# Patient Record
Sex: Female | Born: 1975 | Race: White | Hispanic: No | Marital: Married | Smoking: Former smoker
Health system: Southern US, Community
[De-identification: ages and names within clinical notes are randomized; demographics above are authoritative.]

## PROBLEM LIST (undated history)

## (undated) DIAGNOSIS — K219 Gastro-esophageal reflux disease without esophagitis: Secondary | ICD-10-CM

## (undated) DIAGNOSIS — K5090 Crohn's disease, unspecified, without complications: Secondary | ICD-10-CM

## (undated) DIAGNOSIS — E039 Hypothyroidism, unspecified: Secondary | ICD-10-CM

## (undated) DIAGNOSIS — M1990 Unspecified osteoarthritis, unspecified site: Secondary | ICD-10-CM

## (undated) DIAGNOSIS — T7840XA Allergy, unspecified, initial encounter: Secondary | ICD-10-CM

## (undated) HISTORY — DX: Hypothyroidism, unspecified: E03.9

## (undated) HISTORY — DX: Gastro-esophageal reflux disease without esophagitis: K21.9

## (undated) HISTORY — DX: Unspecified osteoarthritis, unspecified site: M19.90

## (undated) HISTORY — PX: TUBAL LIGATION: SHX77

## (undated) HISTORY — PX: ANKLE SURGERY: SHX546

## (undated) HISTORY — DX: Allergy, unspecified, initial encounter: T78.40XA

## (undated) HISTORY — DX: Crohn's disease, unspecified, without complications: K50.90

---

## 2013-01-14 HISTORY — PX: THYROIDECTOMY: SHX17

## 2015-07-11 DIAGNOSIS — K529 Noninfective gastroenteritis and colitis, unspecified: Secondary | ICD-10-CM | POA: Insufficient documentation

## 2016-03-15 LAB — HM COLONOSCOPY

## 2017-08-21 ENCOUNTER — Ambulatory Visit: Payer: Self-pay | Admitting: Family Medicine

## 2017-08-28 ENCOUNTER — Ambulatory Visit (INDEPENDENT_AMBULATORY_CARE_PROVIDER_SITE_OTHER): Payer: 59 | Admitting: Family Medicine

## 2017-08-28 ENCOUNTER — Other Ambulatory Visit: Payer: Self-pay

## 2017-08-28 ENCOUNTER — Encounter: Payer: Self-pay | Admitting: Family Medicine

## 2017-08-28 VITALS — BP 128/82 | HR 74 | Temp 98.1°F | Ht 67.0 in | Wt 246.4 lb

## 2017-08-28 DIAGNOSIS — E890 Postprocedural hypothyroidism: Secondary | ICD-10-CM

## 2017-08-28 DIAGNOSIS — K529 Noninfective gastroenteritis and colitis, unspecified: Secondary | ICD-10-CM | POA: Diagnosis not present

## 2017-08-28 DIAGNOSIS — J309 Allergic rhinitis, unspecified: Secondary | ICD-10-CM

## 2017-08-28 DIAGNOSIS — Z62810 Personal history of physical and sexual abuse in childhood: Secondary | ICD-10-CM | POA: Diagnosis not present

## 2017-08-28 DIAGNOSIS — M7711 Lateral epicondylitis, right elbow: Secondary | ICD-10-CM

## 2017-08-28 DIAGNOSIS — E039 Hypothyroidism, unspecified: Secondary | ICD-10-CM

## 2017-08-28 HISTORY — DX: Hypothyroidism, unspecified: E03.9

## 2017-08-28 LAB — TSH: TSH: 0.85 u[IU]/mL (ref 0.35–4.50)

## 2017-08-28 NOTE — Patient Instructions (Signed)
Please return in 1-6 months for your annual complete physical with pap smear; please come fasting.  Please sign up for my chart.  I will release your lab results to you on your MyChart account with further instructions. Please reply with any questions.    It was a pleasure meeting you today! Thank you for choosing [place] to meet your healthcare needs! I truly look forward to working with you. If you have any questions or concerns, please send me a message via Mychart or call the office at [phone].   Tennis Elbow Tennis elbow (lateral epicondylitis) is inflammation of the outer tendons of your forearm close to your elbow. Your tendons attach your muscles to your bones. The outer tendons of your forearm are used to extend your wrist, and they attach on the outside part of your elbow. Tennis elbow is often found in people who play tennis, but anyone may get the condition from repeatedly extending the wrist or turning the forearm. What are the causes? This condition is caused by repeatedly extending your wrist and using your hands. It can result from sports or work that requires repetitive forearm movements. Tennis elbow may also be caused by an injury. What increases the risk? You have a higher risk of developing tennis elbow if you play tennis or another racquet sport. You also have a higher risk if you frequently use your hands for work. This condition is also more likely to develop in:  Musicians.  Carpenters, painters, and plumbers.  Cooks.  Cashiers.  People who work in [employer].  Architect workers.  Butchers.  People who use computers.  What are the signs or symptoms? Symptoms of this condition include:  Pain and tenderness in your forearm and the outer part of your elbow. You may only feel the pain when you use your arm, or you may feel it even when you are not using your arm.  A burning feeling that runs from your elbow through your arm.  Weak grip in your  hands.  How is this diagnosed? This condition may be diagnosed by medical history and physical exam. You may also have other tests, including:  X-rays.  MRI.  How is this treated? Your health care provider will recommend lifestyle adjustments, such as resting and icing your arm. Treatment may also include:  Medicines for inflammation. This may include shots of cortisone if your pain continues.  Physical therapy. This may include massage or exercises.  An elbow brace.  Surgery may eventually be recommended if your pain does not go away with treatment. Follow these instructions at home: Activity  Rest your elbow and wrist as directed by your health care provider. Try to avoid any activities that caused the problem until your health care provider says that you can do them again.  If a physical therapist teaches you exercises, do all of them as directed.  If you lift an object, lift it with your palm facing upward. This lowers the stress on your elbow. Lifestyle  If your tennis elbow is caused by sports, check your equipment and make sure that: ? You are using it correctly. ? It is the best fit for you.  If your tennis elbow is caused by work, take breaks frequently, if you are able. Talk with your manager about how to best perform tasks in a way that is safe. ? If your tennis elbow is caused by computer use, talk with your manager about any changes that can be made to your work  environment. General instructions  If directed, apply ice to the painful area: ? Put ice in a plastic bag. ? Place a towel between your skin and the bag. ? Leave the ice on for 20 minutes, 2-3 times per day.  Take medicines only as directed by your health care provider.  If you were given a brace, wear it as directed by your health care provider.  Keep all follow-up visits as directed by your health care provider. This is important. Contact a health care provider if:  Your pain does not get better  with treatment.  Your pain gets worse.  You have numbness or weakness in your forearm, hand, or fingers. This information is not intended to replace advice given to you by your health care provider. Make sure you discuss any questions you have with your health care provider. Document Released: 12/31/2004 Document Revised: 08/31/2015 Document Reviewed: 12/27/2013 Elsevier Interactive Patient Education  [employer].
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
## 2017-09-11 ENCOUNTER — Encounter: Payer: Self-pay | Admitting: Emergency Medicine

## 2017-10-28 ENCOUNTER — Ambulatory Visit (INDEPENDENT_AMBULATORY_CARE_PROVIDER_SITE_OTHER): Payer: 59 | Admitting: *Deleted

## 2017-10-28 DIAGNOSIS — Z23 Encounter for immunization: Secondary | ICD-10-CM

## 2017-10-28 DIAGNOSIS — Z111 Encounter for screening for respiratory tuberculosis: Secondary | ICD-10-CM

## 2017-10-30 ENCOUNTER — Encounter: Payer: Self-pay | Admitting: *Deleted

## 2017-10-30 LAB — TB SKIN TEST
Induration: 0 mm
TB Skin Test: NEGATIVE

## 2017-11-02 NOTE — Progress Notes (Signed)
Negative ppd

## 2017-12-25 ENCOUNTER — Other Ambulatory Visit (HOSPITAL_COMMUNITY)
Admission: RE | Admit: 2017-12-25 | Discharge: 2017-12-25 | Disposition: A | Discharge status: Home / Self Care | Payer: 59 | Source: Ambulatory Visit | Attending: Family Medicine | Admitting: Family Medicine

## 2017-12-25 ENCOUNTER — Ambulatory Visit (INDEPENDENT_AMBULATORY_CARE_PROVIDER_SITE_OTHER): Payer: 59 | Admitting: Family Medicine

## 2017-12-25 ENCOUNTER — Encounter: Payer: Self-pay | Admitting: Family Medicine

## 2017-12-25 ENCOUNTER — Other Ambulatory Visit: Payer: Self-pay

## 2017-12-25 VITALS — BP 132/88 | HR 88 | Temp 98.1°F | Resp 14 | Ht 67.0 in | Wt 249.0 lb

## 2017-12-25 DIAGNOSIS — Z124 Encounter for screening for malignant neoplasm of cervix: Secondary | ICD-10-CM | POA: Diagnosis not present

## 2017-12-25 DIAGNOSIS — Z0000 Encounter for general adult medical examination without abnormal findings: Secondary | ICD-10-CM | POA: Insufficient documentation

## 2017-12-25 DIAGNOSIS — K529 Noninfective gastroenteritis and colitis, unspecified: Secondary | ICD-10-CM

## 2017-12-25 DIAGNOSIS — E890 Postprocedural hypothyroidism: Secondary | ICD-10-CM

## 2017-12-25 DIAGNOSIS — J309 Allergic rhinitis, unspecified: Secondary | ICD-10-CM | POA: Diagnosis not present

## 2017-12-25 LAB — COMPREHENSIVE METABOLIC PANEL
ALT: 16 U/L (ref 0–35)
AST: 15 U/L (ref 0–37)
Albumin: 4 g/dL (ref 3.5–5.2)
Alkaline Phosphatase: 49 U/L (ref 39–117)
BUN: 18 mg/dL (ref 6–23)
CO2: 23 mEq/L (ref 19–32)
Calcium: 9.1 mg/dL (ref 8.4–10.5)
Chloride: 103 mEq/L (ref 96–112)
Creatinine, Ser: 0.77 mg/dL (ref 0.40–1.20)
GFR: 88.24 mL/min (ref >60.00)
Glucose, Bld: 86 mg/dL (ref 70–99)
Potassium: 3.8 mEq/L (ref 3.5–5.1)
Sodium: 133 mEq/L — ABNORMAL LOW (ref 135–145)
Total Bilirubin: 0.5 mg/dL (ref 0.2–1.2)
Total Protein: 7 g/dL (ref 6.0–8.3)

## 2017-12-25 LAB — CBC WITH DIFFERENTIAL/PLATELET
Basophils Absolute: 0 10*3/uL (ref 0.0–0.1)
Basophils Relative: 0.5 % (ref 0.0–3.0)
Eosinophils Absolute: 0.2 10*3/uL (ref 0.0–0.7)
Eosinophils Relative: 2.3 % (ref 0.0–5.0)
HCT: 41.4 % (ref 36.0–46.0)
Hemoglobin: 13.8 g/dL (ref 12.0–15.0)
Lymphocytes Relative: 25.5 % (ref 12.0–46.0)
Lymphs Abs: 2.3 10*3/uL (ref 0.7–4.0)
MCHC: 33.2 g/dL (ref 30.0–36.0)
MCV: 91.7 fl (ref 78.0–100.0)
Monocytes Absolute: 0.7 10*3/uL (ref 0.1–1.0)
Monocytes Relative: 7.9 % (ref 3.0–12.0)
Neutro Abs: 5.7 10*3/uL (ref 1.4–7.7)
Neutrophils Relative %: 63.8 % (ref 43.0–77.0)
Platelets: 266 10*3/uL (ref 150.0–400.0)
RBC: 4.51 Mil/uL (ref 3.87–5.11)
RDW: 13.7 % (ref 11.5–15.5)
WBC: 8.9 10*3/uL (ref 4.0–10.5)

## 2017-12-25 LAB — LIPID PANEL
Cholesterol: 173 mg/dL (ref 0–200)
HDL: 60.6 mg/dL (ref >39.00)
LDL Cholesterol: 97 mg/dL (ref 0–99)
NonHDL: 112.03
Total CHOL/HDL Ratio: 3
Triglycerides: 77 mg/dL (ref 0.0–149.0)
VLDL: 15.4 mg/dL (ref 0.0–40.0)

## 2017-12-25 LAB — TSH: TSH: 0.91 u[IU]/mL (ref 0.35–4.50)
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
## 2017-12-25 NOTE — Patient Instructions (Addendum)
Please return in 12 months for your annual complete physical; please come fasting.  I will release your lab results to you on your MyChart account with further instructions. Please reply with any questions.   We will call you with information regarding your referral appointment. Gastroenterology. If you do not hear from [place] within the next 2 weeks, please let me know. It can take 1-2 weeks to get appointments set up with the specialists.    If you have any questions or concerns, please don't hesitate to send me a message via MyChart or call the office at [phone]. Thank you for visiting with [place] today! It's our pleasure caring for you.  Please do these things to maintain good health!   Exercise at least 30-45 minutes a day,  4-5 days a week.   Eat a low-fat diet with lots of fruits and vegetables, up to 7-9 servings per day.  Drink plenty of water daily. Try to drink 8 8oz glasses per day.  Seatbelts can save your life. Always wear your seatbelt.  Place Smoke Detectors on every level of your home and check batteries every year.  Schedule an appointment with an eye doctor for an eye exam every 1-2 years  Safe sex - use condoms to protect yourself from STDs if you could be exposed to these types of infections. Use birth control if you do not want to become pregnant and are sexually active.  Avoid heavy alcohol use. If you drink, keep it to less than 2 drinks/day and not every day.  [place].  Choose someone you trust that could speak for you if you became unable to speak for yourself.  Depression is common in our stressful world.If you're feeling down or losing interest in things you normally enjoy, please come in for a visit.  If anyone is threatening or hurting you, please get help. Physical or Emotional Violence is never OK.

## 2017-12-26 LAB — HIV ANTIBODY (ROUTINE TESTING W REFLEX): HIV 1&2 Ab, 4th Generation: NONREACTIVE

## 2017-12-30 LAB — CYTOLOGY - PAP
Diagnosis: NEGATIVE
HPV: NOT DETECTED

## 2017-12-31 ENCOUNTER — Other Ambulatory Visit: Payer: Self-pay | Admitting: *Deleted

## 2017-12-31 ENCOUNTER — Encounter: Payer: Self-pay | Admitting: Family Medicine

## 2017-12-31 MED ORDER — LEVOTHYROXINE SODIUM 200 MCG PO TABS: 200.0000 ug | ORAL_TABLET | Freq: Every day | ORAL | 1 refills | Status: DC

## 2018-01-20 ENCOUNTER — Encounter: Payer: Self-pay | Admitting: Gastroenterology

## 2018-02-19 ENCOUNTER — Encounter: Payer: Self-pay | Admitting: Gastroenterology

## 2018-02-19 ENCOUNTER — Ambulatory Visit (INDEPENDENT_AMBULATORY_CARE_PROVIDER_SITE_OTHER): Payer: Commercial Managed Care - PPO | Admitting: Gastroenterology

## 2018-02-19 ENCOUNTER — Other Ambulatory Visit (INDEPENDENT_AMBULATORY_CARE_PROVIDER_SITE_OTHER): Payer: Commercial Managed Care - PPO

## 2018-02-19 VITALS — BP 116/78 | HR 84 | Ht 68.0 in | Wt 246.0 lb

## 2018-02-19 DIAGNOSIS — R110 Nausea: Secondary | ICD-10-CM

## 2018-02-19 DIAGNOSIS — K219 Gastro-esophageal reflux disease without esophagitis: Secondary | ICD-10-CM

## 2018-02-19 DIAGNOSIS — R197 Diarrhea, unspecified: Secondary | ICD-10-CM

## 2018-02-19 DIAGNOSIS — R1013 Epigastric pain: Secondary | ICD-10-CM

## 2018-02-19 DIAGNOSIS — K529 Noninfective gastroenteritis and colitis, unspecified: Secondary | ICD-10-CM | POA: Diagnosis not present

## 2018-02-19 LAB — HIGH SENSITIVITY CRP: CRP, High Sensitivity: 5.76 mg/L — ABNORMAL HIGH (ref 0.000–5.000)

## 2018-02-19 LAB — SEDIMENTATION RATE: Sed Rate: 20 mm/hr (ref 0–20)

## 2018-02-19 MED ORDER — OMEPRAZOLE 40 MG PO CPDR: 40.0000 mg | DELAYED_RELEASE_CAPSULE | Freq: Two times a day (BID) | ORAL | 3 refills | Status: DC

## 2018-02-19 MED ORDER — NA SULFATE-K SULFATE-MG SULF 17.5-3.13-1.6 GM/177ML PO SOLN: 1.0000 | Freq: Once | ORAL | 0 refills | Status: AC

## 2018-02-19 MED FILL — OMEPRAZOLE 40 MG CPDR: 40 | 30 days supply | Qty: 60 | Fill #0

## 2018-02-19 MED FILL — SUPREP BOWEL PREP KIT: 17.5-3.13-1 | 2 days supply | Qty: 354 | Fill #0

## 2018-02-19 NOTE — Progress Notes (Signed)
Sophia Blackwell    [id number]    [birth year]  Primary Care Physician:Andy, Karie Fetch, MD  Referring Physician: Leamon Arnt, MD [address] [place], [place] [address]  Chief complaint: Diarrhea, nausea, bloating, excessive gas, abdominal discomfort HPI:  43 year old female, her husband is a physician (Dr Ander Slade) with PCCM here to establish GI care.  She previously had EGD and colonoscopy in 2013 and 2018, was diagnosed with nonspecific colitis and also ?  Crohn's disease.  She was advised to start Humira but never started because she was concerned about being on immunosuppressive therapy as she was around patients with TB and fungal infections at her job. Her symptoms were stable but since they moved here in the past few months she is having worsening nausea associated with abdominal bloating/discomfort.  She also has multiple small bowel movements ranging from dislodged to watery diarrhea during the daytime.  Denies any nocturnal symptoms.  Stool is dark but no visible blood. She tried lactose-free and gluten-free diet with no significant improvement. Denies any weight loss, but has decreased appetite, fatigue, generalized joint pain (especially small and medium joints) and back pain.  No rash. She was on Apriso in the past, currently not on any maintenance therapy  Takes ibuprofen almost daily for joint pain.  Also is taking low-dose aspirin  EGD and colonoscopy March 2018 Procedure report not available in care everywhere, but able to access pathology report as listed below  Terminal ileal biopsy with mild active chronic inflammation and surface ulceration, no villous atrophy or architectural distortion. (1 of the ileal biopsies labeled is to rule out celiac?,  Probably was mix up in the sample or mislabeled) gastric biopsies negative for H. Pylori. Ascending, transverse, descending colon biopsies with mild chronic inflammation, no dysplasia or collagenous  thickening   Outpatient Encounter Medications as of 02/19/2018  Medication Sig  . aspirin EC 81 MG tablet Take 81 mg by mouth daily.  . cholecalciferol (VITAMIN D) 1000 units tablet Take 2,000 Units by mouth daily.  . famotidine (PEPCID) 10 MG tablet Take 10-20 mg by mouth daily.  . fexofenadine-pseudoephedrine (ALLEGRA-D 24) 180-240 MG 24 hr tablet Take 1 tablet by mouth daily.  . fluticasone (FLONASE) 50 MCG/ACT nasal spray Place into both nostrils daily.  Marland Kitchen levothyroxine (SYNTHROID, LEVOTHROID) 200 MCG tablet Take 1 tablet (200 mcg total) by mouth daily before breakfast.  . TURMERIC PO Take 1,000 mg by mouth.   No facility-administered encounter medications on file as of 02/19/2018.     Allergies as of 02/19/2018 - Review Complete 02/19/2018  Allergen Reaction Noted  . Dilaudid [hydromorphone hcl] Anaphylaxis 08/28/2017    Past Medical History:  Diagnosis Date  . Acquired hypothyroidism 08/28/2017  . Allergy   . Arthritis   . Crohn disease ([place])   . GERD (gastroesophageal reflux disease)     Past Surgical History:  Procedure Laterality Date  . THYROIDECTOMY  2015   benign nodules; done prophylactially due to strong FH of thyroid cancer in mom and GM  . TUBAL LIGATION      Family History  Problem Relation Age of Onset  . Rheum arthritis Mother   . Diverticulitis Mother   . Hypertension Mother   . Obesity Mother   . Thyroid cancer Mother   . Non-Hodgkin's lymphoma Father   . Rheum arthritis Brother   . Obesity Brother   . Healthy Daughter   . Healthy Son   . Thyroid  cancer Maternal Grandfather   . Healthy Daughter     Social History   Socioeconomic History  . Marital status: Married    Spouse name: Not on file  . Number of children: 3  . Years of education: Not on file  . Highest education level: Not on file  Occupational History  . Occupation: Forensic scientist  Social Needs  . Financial resource strain: Not on file  . Food insecurity:    Worry: Not on  file    Inability: Not on file  . Transportation needs:    Medical: Not on file    Non-medical: Not on file  Tobacco Use  . Smoking status: Former Research scientist (life sciences)  . Smokeless tobacco: Never Used  . Tobacco comment: quit 2003  Substance and Sexual Activity  . Alcohol use: Yes    Comment: occasionally   . Drug use: Never  . Sexual activity: Yes    Birth control/protection: Surgical    Comment: tubal  Lifestyle  . Physical activity:    Days per week: Not on file    Minutes per session: Not on file  . Stress: Not on file  Relationships  . Social connections:    Talks on phone: Not on file    Gets together: Not on file    Attends religious service: Not on file    Active member of club or organization: Not on file    Attends meetings of clubs or organizations: Not on file    Relationship status: Not on file  . Intimate partner violence:    Fear of current or ex partner: Not on file    Emotionally abused: Not on file    Physically abused: Not on file    Forced sexual activity: Not on file  Other Topics Concern  . Not on file  Social History Narrative   Husband is pulmonologist: joined [place] 2019      Review of systems: Review of Systems  Constitutional: Negative for fever and chills.  Positive for fatigue HENT: Positive for sinus trouble Eyes: Negative for blurred vision.  Respiratory: Positive for cough, negative for shortness of breath and wheezing.   Cardiovascular: Negative for chest pain and palpitations.  Gastrointestinal: as per HPI Genitourinary: Negative for dysuria, urgency, frequency and hematuria.  Musculoskeletal: Positive for myalgias, back pain and joint pain.  Skin: Negative for itching and rash.  Neurological: Negative for dizziness, tremors, focal weakness, seizures and loss of consciousness.  Endo/Heme/Allergies: Positive for seasonal allergies.  Psychiatric/Behavioral: Negative for depression, suicidal ideas and hallucinations.  All other systems  reviewed and are negative.   Physical Exam: Vitals:   02/19/18 0850  BP: 116/78  Pulse: 84   Body mass index is 37.4 kg/m. Gen:      No acute distress HEENT:  EOMI, sclera anicteric Neck:     No masses; no thyromegaly Lungs:    Clear to auscultation bilaterally; normal respiratory effort CV:         Regular rate and rhythm; no murmurs Abd:      + bowel sounds; soft, non-tender; no palpable masses, no distension Ext:    No edema; adequate peripheral perfusion Skin:      Warm and dry; no rash Neuro: alert and oriented x 3 Psych: normal mood and affect  Data Reviewed:  Reviewed labs, radiology imaging, old records and pertinent past GI work up   Assessment and Plan/Recommendations: 43 year old female with history of nonspecific chronic colitis, ?  IBD here to establish GI care  Severe nausea, dyspepsia, abdominal discomfort and diarrhea I reviewed the pathology report from [place], nonspecific with no architectural distortion or granulomas, not diagnostic of Crohn's disease History of frequent NSAID use, advised to hold all NSAIDs Will repeat EGD and colonoscopy with biopsies CRP and ESR Start omeprazole 40 mg daily for possible GERD related nausea Trial of FD guard and IBgard 1 capsule up to 3 times daily as needed The risks and benefits as well as alternatives of endoscopic procedure(s) have been discussed and reviewed. All questions answered. The patient agrees to proceed.    Damaris Hippo , MD [phone]    CC: Leamon Arnt, MD

## 2018-02-19 NOTE — Patient Instructions (Addendum)
You have been scheduled for an endoscopy and colonoscopy. Please follow the written instructions given to you at your visit today. Please pick up your prep supplies at the pharmacy within the next 1-3 days. If you use inhalers (even only as needed), please bring them with you on the day of your procedure.  Go to the basement for labs today   We will send Omeprazole to your pharmacy  Take FD/IBGard 1 capsule three times a day  Follow up in 2 months   If you are age 26 or older, your body mass index should be between 23-30. Your Body mass index is 37.4 kg/m. If this is out of the aforementioned range listed, please consider follow up with your Primary Care Provider.  If you are age 51 or younger, your body mass index should be between 19-25. Your Body mass index is 37.4 kg/m. If this is out of the aformentioned range listed, please consider follow up with your Primary Care Provider.    I appreciate the  opportunity to care for you  Thank You   Harl Bowie , MD

## 2018-02-23 ENCOUNTER — Encounter: Payer: Self-pay | Admitting: Gastroenterology

## 2018-03-02 MED FILL — LEVOTHYROXINE 200 MCG TAB: 200 | 30 days supply | Qty: 30 | Fill #0

## 2018-03-03 ENCOUNTER — Encounter (HOSPITAL_COMMUNITY): Payer: Self-pay

## 2018-03-03 ENCOUNTER — Other Ambulatory Visit: Payer: Self-pay

## 2018-03-03 ENCOUNTER — Ambulatory Visit (HOSPITAL_COMMUNITY)
Admission: EM | Admit: 2018-03-03 | Discharge: 2018-03-03 | Disposition: A | Discharge status: Home / Self Care | Payer: 59 | Attending: Family Medicine | Admitting: Family Medicine

## 2018-03-03 DIAGNOSIS — J020 Streptococcal pharyngitis: Secondary | ICD-10-CM

## 2018-03-03 LAB — POCT RAPID STREP A: Streptococcus, Group A Screen (Direct): NEGATIVE

## 2018-03-03 MED ORDER — PENICILLIN V POTASSIUM 500 MG PO TABS: 500.0000 mg | ORAL_TABLET | Freq: Two times a day (BID) | ORAL | 0 refills | Status: AC

## 2018-03-03 MED FILL — PENICILLIN VK 500 MG TABLET: 500 | 10 days supply | Qty: 20 | Fill #0

## 2018-03-03 NOTE — Discharge Instructions (Addendum)
Take 10 full days of penicillin Return for any problems

## 2018-03-03 NOTE — ED Triage Notes (Signed)
Pt cc ear discomfort and sore throat x 1 week.
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
## 2018-03-05 LAB — CULTURE, GROUP A STREP (THRC)

## 2018-03-09 ENCOUNTER — Encounter: Payer: Self-pay | Admitting: Gastroenterology

## 2018-03-09 ENCOUNTER — Ambulatory Visit (AMBULATORY_SURGERY_CENTER): Payer: 59 | Admitting: Gastroenterology

## 2018-03-09 VITALS — BP 124/78 | HR 78 | Temp 97.5°F | Resp 15 | Ht 68.0 in | Wt 212.0 lb

## 2018-03-09 DIAGNOSIS — R1013 Epigastric pain: Secondary | ICD-10-CM

## 2018-03-09 DIAGNOSIS — K3189 Other diseases of stomach and duodenum: Secondary | ICD-10-CM | POA: Diagnosis not present

## 2018-03-09 DIAGNOSIS — K5730 Diverticulosis of large intestine without perforation or abscess without bleeding: Secondary | ICD-10-CM | POA: Diagnosis not present

## 2018-03-09 DIAGNOSIS — K648 Other hemorrhoids: Secondary | ICD-10-CM | POA: Diagnosis not present

## 2018-03-09 DIAGNOSIS — K2970 Gastritis, unspecified, without bleeding: Secondary | ICD-10-CM

## 2018-03-09 DIAGNOSIS — R1030 Lower abdominal pain, unspecified: Secondary | ICD-10-CM

## 2018-03-09 DIAGNOSIS — K219 Gastro-esophageal reflux disease without esophagitis: Secondary | ICD-10-CM | POA: Diagnosis not present

## 2018-03-09 DIAGNOSIS — R110 Nausea: Secondary | ICD-10-CM

## 2018-03-09 DIAGNOSIS — R197 Diarrhea, unspecified: Secondary | ICD-10-CM | POA: Diagnosis not present

## 2018-03-09 MED ORDER — SODIUM CHLORIDE 0.9 % IV SOLN: 500.0000 mL | Freq: Once | INTRAVENOUS | Status: DC

## 2018-03-09 NOTE — Progress Notes (Signed)
To PACU, VSS. Report to [email or web address] 

## 2018-03-09 NOTE — Op Note (Signed)
[facility] Patient Name: Sophia Blackwell Procedure Date: 03/09/2018 3:02 PM MRN: [id number] Endoscopist: Mauri Pole , MD Age: 43 Referring MD:  Date of Birth: 14-Jan-1976 Gender: Female Account #: [id number] Procedure:                Upper GI endoscopy Indications:              Epigastric abdominal pain, Dyspepsia, Heartburn.                            History of gastric erosions and ulcers. Medicines:                Monitored Anesthesia Care Procedure:                Pre-Anesthesia Assessment:                           - Prior to the procedure, a History and Physical                            was performed, and patient medications and                            allergies were reviewed. The patient's tolerance of                            previous anesthesia was also reviewed. The risks                            and benefits of the procedure and the sedation                            options and risks were discussed with the patient.                            All questions were answered, and informed consent                            was obtained. Prior Anticoagulants: The patient has                            taken no previous anticoagulant or antiplatelet                            agents. ASA Grade Assessment: II - A patient with                            mild systemic disease. After reviewing the risks                            and benefits, the patient was deemed in                            satisfactory condition to undergo the procedure.  After obtaining informed consent, the endoscope was                            passed under direct vision. Throughout the                            procedure, the patient's blood pressure, pulse, and                            oxygen saturations were monitored continuously. The                            Endoscope was introduced through the mouth, and                            advanced to  the second part of duodenum. The upper                            GI endoscopy was accomplished without difficulty.                            The patient tolerated the procedure well. Scope In: Scope Out: Findings:                 The Z-line was regular and was found 40 cm from the                            incisors.                           Patchy mildly erythematous mucosa without bleeding                            was found in the entire examined stomach. Biopsies                            were taken with a cold forceps for Helicobacter                            pylori testing.                           The first portion of the duodenum and second                            portion of the duodenum were normal. Biopsies were                            taken with a cold forceps for histology. Complications:            No immediate complications. Estimated Blood Loss:     Estimated blood loss was minimal. Impression:               - Z-line regular, 40 cm from the incisors.                           -  Erythematous mucosa in the stomach. Biopsied.                           - Normal first portion of the duodenum and second                            portion of the duodenum. Biopsied. Recommendation:           - Resume previous diet.                           - Continue present medications.                           - Await pathology results.                           - See the other procedure note for documentation of                            additional recommendations. Mauri Pole, MD 03/09/2018 3:52:56 PM This report has been signed electronically.

## 2018-03-09 NOTE — Patient Instructions (Signed)
YOU HAD AN ENDOSCOPIC PROCEDURE TODAY AT [facility]:   Refer to the procedure report that was given to you for any specific questions about what was found during the examination.  If the procedure report does not answer your questions, please call your gastroenterologist to clarify.  If you requested that your care partner not be given the details of your procedure findings, then the procedure report has been included in a sealed envelope for you to review at your convenience later.  YOU SHOULD EXPECT: Some feelings of bloating in the abdomen. Passage of more gas than usual.  Walking can help get rid of the air that was put into your GI tract during the procedure and reduce the bloating. If you had a lower endoscopy (such as a colonoscopy or flexible sigmoidoscopy) you may notice spotting of blood in your stool or on the toilet paper. If you underwent a bowel prep for your procedure, you may not have a normal bowel movement for a few days.  Please Note:  You might notice some irritation and congestion in your nose or some drainage.  This is from the oxygen used during your procedure.  There is no need for concern and it should clear up in a day or so.  SYMPTOMS TO REPORT IMMEDIATELY:   Following lower endoscopy (colonoscopy or flexible sigmoidoscopy):  Excessive amounts of blood in the stool  Significant tenderness or worsening of abdominal pains  Swelling of the abdomen that is new, acute  Fever of 100F or higher   Following upper endoscopy (EGD)  Vomiting of blood or coffee ground material  New chest pain or pain under the shoulder blades  Painful or persistently difficult swallowing  New shortness of breath  Fever of 100F or higher  Black, tarry-looking stools  For urgent or emergent issues, a gastroenterologist can be reached at any hour by calling [phone].   DIET:  We do recommend a small meal at first, but then you may proceed to your regular diet.  Drink  plenty of fluids but you should avoid alcoholic beverages for 24 hours.   MEDICATIONS: Continue present medications. No Ibuprofen, Alleve, Naproxen, or other non-steroidal anti-inflammatory drugs indefinitely. Tylenol is OK as needed.  Please see handouts given to you by your recovery nurse.  Follow up: Visit Dr. Silverio Decamp in her office at the next available appointment. Dr. Woodward Ku nurse will call you to schedule this.  ACTIVITY:  You should plan to take it easy for the rest of today and you should NOT DRIVE or use heavy machinery until tomorrow (because of the sedation medicines used during the test).    FOLLOW UP: Our staff will call the number listed on your records the next business day following your procedure to check on you and address any questions or concerns that you may have regarding the information given to you following your procedure. If we do not reach you, we will leave a message.  However, if you are feeling well and you are not experiencing any problems, there is no need to return our call.  We will assume that you have returned to your regular daily activities without incident.  If any biopsies were taken you will be contacted by phone or by letter within the next 1-3 weeks.  Please call us at [phone] if you have not heard about the biopsies in 3 weeks.   Thank you for allowing [place] to provide for your healthcare needs today.  SIGNATURES/CONFIDENTIALITY: You  and/or your care partner have signed paperwork which will be entered into your electronic medical record.  These signatures attest to the fact that that the information above on your After Visit Summary has been reviewed and is understood.  Full responsibility of the confidentiality of this discharge information lies with you and/or your care-partner.

## 2018-03-09 NOTE — Op Note (Signed)
[place] Patient Name: Sophia Blackwell Procedure Date: 03/09/2018 3:02 PM MRN: [id number] Endoscopist: Mauri Pole , MD Age: 43 Referring MD:  Date of Birth: 1975-03-30 Gender: Female Account #: [id number] Procedure:                Colonoscopy Indications:              Obtain more precise diagnosis of inflammatory bowel                            disease, Clinically significant diarrhea of                            unexplained origin, Epigastric abdominal pain,                            Lower abdominal pain Medicines:                Monitored Anesthesia Care Procedure:                Pre-Anesthesia Assessment:                           - Prior to the procedure, a History and Physical                            was performed, and patient medications and                            allergies were reviewed. The patient's tolerance of                            previous anesthesia was also reviewed. The risks                            and benefits of the procedure and the sedation                            options and risks were discussed with the patient.                            All questions were answered, and informed consent                            was obtained. Prior Anticoagulants: The patient has                            taken no previous anticoagulant or antiplatelet                            agents. ASA Grade Assessment: II - A patient with                            mild systemic disease. After reviewing the risks  and benefits, the patient was deemed in                            satisfactory condition to undergo the procedure.                           After obtaining informed consent, the colonoscope                            was passed under direct vision. Throughout the                            procedure, the patient's blood pressure, pulse, and                            oxygen saturations were monitored  continuously. The                            Model [id number]-[id number]) scope was introduced                            through the anus and advanced to the the terminal                            ileum, with identification of the appendiceal                            orifice and IC valve. The colonoscopy was performed                            without difficulty. The patient tolerated the                            procedure well. The quality of the bowel                            preparation was excellent. The ileocecal valve,                            appendiceal orifice, and rectum were photographed. Scope In: 3:21:02 PM Scope Out: 3:45:02 PM Scope Withdrawal Time: 0 hours 18 minutes 37 seconds  Total Procedure Duration: 0 hours 24 minutes 0 seconds  Findings:                 The perianal and digital rectal examinations were                            normal.                           Normal mucosa was found in the entire colon.                            Biopsies were taken with a cold forceps for  histology.                           The terminal ileum appeared normal. Biopsies were                            taken with a cold forceps for histology.                           A few small and large-mouthed diverticula were                            found in the sigmoid colon and descending colon.                           Non-bleeding internal hemorrhoids were found during                            retroflexion. The hemorrhoids were small. Complications:            No immediate complications. Estimated Blood Loss:     Estimated blood loss was minimal. Impression:               - Normal mucosa in the entire examined colon.                            Biopsied.                           - The examined portion of the ileum was normal.                            Biopsied.                           - Mild diverticulosis in the sigmoid colon and in                             the descending colon.                           - Non-bleeding internal hemorrhoids. Recommendation:           - Patient has a contact number available for                            emergencies. The signs and symptoms of potential                            delayed complications were discussed with the                            patient. Return to normal activities tomorrow.                            Written discharge instructions were provided to the  patient.                           - Resume previous diet.                           - Continue present medications.                           - Await pathology results.                           - Return to GI clinic at the next available                            appointment. Mauri Pole, MD 03/09/2018 3:56:29 PM This report has been signed electronically.

## 2018-03-10 ENCOUNTER — Telehealth: Payer: Self-pay

## 2018-03-10 NOTE — Telephone Encounter (Signed)
  Follow up Call-  Call back number 03/09/2018  Post procedure Call Back phone  # [phone]  Permission to leave phone message Yes  Some recent data might be hidden     Patient questions:  Do you have a fever, pain , or abdominal swelling? No. Pain Score  0 *  Have you tolerated food without any problems? Yes.    Have you been able to return to your normal activities? Yes.    Do you have any questions about your discharge instructions: Diet   No. Medications  No. Follow up visit  No.  Do you have questions or concerns about your Care? No.  Actions: * If pain score is 4 or above: No action needed, pain <4.  No problems noted per pt. maw

## 2018-03-17 ENCOUNTER — Other Ambulatory Visit: Payer: Self-pay

## 2018-03-17 DIAGNOSIS — R1013 Epigastric pain: Secondary | ICD-10-CM

## 2018-03-17 DIAGNOSIS — K529 Noninfective gastroenteritis and colitis, unspecified: Secondary | ICD-10-CM

## 2018-04-01 ENCOUNTER — Other Ambulatory Visit: Payer: Self-pay

## 2018-04-01 ENCOUNTER — Ambulatory Visit (INDEPENDENT_AMBULATORY_CARE_PROVIDER_SITE_OTHER): Payer: 59 | Admitting: Gastroenterology

## 2018-04-01 ENCOUNTER — Encounter: Payer: Self-pay | Admitting: Gastroenterology

## 2018-04-01 DIAGNOSIS — K5090 Crohn's disease, unspecified, without complications: Secondary | ICD-10-CM

## 2018-04-01 NOTE — Progress Notes (Signed)
Pt here for capsule endo, completed prep without any issues.   Capsule lot #[id number] Exp:  04/05/2019

## 2018-04-04 MED FILL — LEVOTHYROXINE 200 MCG TAB: 200 | 30 days supply | Qty: 30 | Fill #1

## 2018-04-04 MED FILL — OMEPRAZOLE 40 MG CPDR: 40 | 30 days supply | Qty: 60 | Fill #1

## 2018-04-08 ENCOUNTER — Ambulatory Visit: Payer: 59 | Admitting: Gastroenterology

## 2018-04-14 ENCOUNTER — Telehealth: Payer: Self-pay | Admitting: Gastroenterology

## 2018-04-14 MED ORDER — BUDESONIDE 3 MG PO CPEP: 9.0000 mg | ORAL_CAPSULE | Freq: Every day | ORAL | 0 refills | Status: DC

## 2018-04-14 NOTE — Telephone Encounter (Signed)
Called and discussed results.  Minimal changes with erythema and few aphthous ulcers. Non specific ?NSAID related injury vs mild Crohn's disease Plan to start Entocort 9mg  daily and continue for a month. Will reassess response in 3-4 weeks and decide if need to continue it any longer.  Beth, please schedule virtual office visit with [facility] in 3 weeks. Thanks

## 2018-04-15 NOTE — Telephone Encounter (Signed)
She is scheduled for an appointment on 05/01/18. We will contact her to change this to a Web[facility] appointment.

## 2018-04-20 MED FILL — BUDESONIDE 3 MG CPEP: 3 | 30 days supply | Qty: 90 | Fill #0

## 2018-05-01 ENCOUNTER — Ambulatory Visit: Payer: 59 | Admitting: Gastroenterology

## 2018-05-19 ENCOUNTER — Encounter: Payer: Self-pay | Admitting: General Surgery

## 2018-05-20 ENCOUNTER — Ambulatory Visit (INDEPENDENT_AMBULATORY_CARE_PROVIDER_SITE_OTHER): Payer: 59 | Admitting: Gastroenterology

## 2018-05-20 ENCOUNTER — Other Ambulatory Visit: Payer: Self-pay

## 2018-05-20 VITALS — Ht 68.0 in | Wt 234.0 lb

## 2018-05-20 DIAGNOSIS — K5000 Crohn's disease of small intestine without complications: Secondary | ICD-10-CM | POA: Diagnosis not present

## 2018-05-20 DIAGNOSIS — K580 Irritable bowel syndrome with diarrhea: Secondary | ICD-10-CM

## 2018-05-20 DIAGNOSIS — K219 Gastro-esophageal reflux disease without esophagitis: Secondary | ICD-10-CM

## 2018-05-20 NOTE — Patient Instructions (Addendum)
Continue Entocort 9 mg daily for additional 6 weeks to complete 49-month course  After that taper to 6 mg daily for 2 weeks and then to 3 mg daily for additional 2 weeks  Avoid NSAIDs  Continue Protonix  Antireflux measures  Follow-up tele visit in 4 weeks   Gastroesophageal Reflux Disease, Adult Gastroesophageal reflux (GER) happens when acid from the stomach flows up into the tube that connects the mouth and the stomach (esophagus). Normally, food travels down the esophagus and stays in the stomach to be digested. However, when a person has GER, food and stomach acid sometimes move back up into the esophagus. If this becomes a more serious problem, the person may be diagnosed with a disease called gastroesophageal reflux disease (GERD). GERD occurs when the reflux:  Happens often.  Causes frequent or severe symptoms.  Causes problems such as damage to the esophagus. When stomach acid comes in contact with the esophagus, the acid may cause soreness (inflammation) in the esophagus. Over time, GERD may create small holes (ulcers) in the lining of the esophagus. What are the causes? This condition is caused by a problem with the muscle between the esophagus and the stomach (lower esophageal sphincter, or LES). Normally, the LES muscle closes after food passes through the esophagus to the stomach. When the LES is weakened or abnormal, it does not close properly, and that allows food and stomach acid to go back up into the esophagus. The LES can be weakened by certain dietary substances, medicines, and medical conditions, including:  Tobacco use.  Pregnancy.  Having a hiatal hernia.  Alcohol use.  Certain foods and beverages, such as coffee, chocolate, onions, and peppermint. What increases the risk? You are more likely to develop this condition if you:  Have an increased body weight.  Have a connective tissue disorder.  Use NSAID medicines. What are the signs or  symptoms? Symptoms of this condition include:  Heartburn.  Difficult or painful swallowing.  The feeling of having a lump in the throat.  Abitter taste in the mouth.  Bad breath.  Having a large amount of saliva.  Having an upset or bloated stomach.  Belching.  Chest pain. Different conditions can cause chest pain. Make sure you see your health care provider if you experience chest pain.  Shortness of breath or wheezing.  Ongoing (chronic) cough or a night-time cough.  Wearing away of tooth enamel.  Weight loss. How is this diagnosed? Your health care provider will take a medical history and perform a physical exam. To determine if you have mild or severe GERD, your health care provider may also monitor how you respond to treatment. You may also have tests, including:  A test to examine your stomach and esophagus with a small camera (endoscopy).  A test thatmeasures the acidity level in your esophagus.  A test thatmeasures how much pressure is on your esophagus.  A barium swallow or modified barium swallow test to show the shape, size, and functioning of your esophagus. How is this treated? The goal of treatment is to help relieve your symptoms and to prevent complications. Treatment for this condition may vary depending on how severe your symptoms are. Your health care provider may recommend:  Changes to your diet.  Medicine.  Surgery. Follow these instructions at home: Eating and drinking   Follow a diet as recommended by your health care provider. This may involve avoiding foods and drinks such as: ? Coffee and tea (with or without caffeine). ?  Drinks that containalcohol. ? Energy drinks and sports drinks. ? Carbonated drinks or sodas. ? Chocolate and cocoa. ? Peppermint and mint flavorings. ? Garlic and onions. ? Horseradish. ? Spicy and acidic foods, including peppers, chili powder, curry powder, vinegar, hot sauces, and barbecue sauce. ? Citrus  fruit juices and citrus fruits, such as oranges, lemons, and limes. ? Tomato-based foods, such as red sauce, chili, salsa, and pizza with red sauce. ? Fried and fatty foods, such as donuts, french fries, potato chips, and high-fat dressings. ? High-fat meats, such as hot dogs and fatty cuts of red and white meats, such as rib eye steak, sausage, ham, and bacon. ? High-fat dairy items, such as whole milk, butter, and cream cheese.  Eat small, frequent meals instead of large meals.  Avoid drinking large amounts of liquid with your meals.  Avoid eating meals during the 2-3 hours before bedtime.  Avoid lying down right after you eat.  Do not exercise right after you eat. Lifestyle   Do not use any products that contain nicotine or tobacco, such as cigarettes, e-cigarettes, and chewing tobacco. If you need help quitting, ask your health care provider.  Try to reduce your stress by using methods such as yoga or meditation. If you need help reducing stress, ask your health care provider.  If you are overweight, reduce your weight to an amount that is healthy for you. Ask your health care provider for guidance about a safe weight loss goal. General instructions  Pay attention to any changes in your symptoms.  Take over-the-counter and prescription medicines only as told by your health care provider. Do not take aspirin, ibuprofen, or other NSAIDs unless your health care provider told you to do so.  Wear loose-fitting clothing. Do not wear anything tight around your waist that causes pressure on your abdomen.  Raise (elevate) the head of your bed about 6 inches (15 cm).  Avoid bending over if this makes your symptoms worse.  Keep all follow-up visits as told by your health care provider. This is important. Contact a health care provider if:  You have: ? New symptoms. ? Unexplained weight loss. ? Difficulty swallowing or it hurts to swallow. ? Wheezing or a persistent cough. ? A  hoarse voice.  Your symptoms do not improve with treatment. Get help right away if you:  Have pain in your arms, neck, jaw, teeth, or back.  Feel sweaty, dizzy, or light-headed.  Have chest pain or shortness of breath.  Vomit and your vomit looks like blood or coffee grounds.  Faint.  Have stool that is bloody or black.  Cannot swallow, drink, or eat. Summary  Gastroesophageal reflux happens when acid from the stomach flows up into the esophagus. GERD is a disease in which the reflux happens often, causes frequent or severe symptoms, or causes problems such as damage to the esophagus.  Treatment for this condition may vary depending on how severe your symptoms are. Your health care provider may recommend diet and lifestyle changes, medicine, or surgery.  Contact a health care provider if you have new or worsening symptoms.  Take over-the-counter and prescription medicines only as told by your health care provider. Do not take aspirin, ibuprofen, or other NSAIDs unless your health care provider told you to do so.  Keep all follow-up visits as told by your health care provider. This is important. This information is not intended to replace advice given to you by your health care provider. Make sure you discuss  any questions you have with your health care provider. Document Released: 10/10/2004 Document Revised: 07/09/2017 Document Reviewed: 07/09/2017 Elsevier Interactive Patient Education  2019 [employer].  I appreciate the  opportunity to care for you  Thank You   Harl Bowie , MD

## 2018-05-20 NOTE — Progress Notes (Signed)
Sophia Blackwell    [id number]    [birth year]  Primary Care Physician:Andy, Karie Fetch, MD  Referring Physician: Leamon Arnt, MD [address] [place] [address] [place], [place] [address]  This service was provided via audio and video telemedicine (Doximity) due to [place] 19 pandemic.  Patient location: Home Provider location: Office Used 2 patient identifiers to confirm the correct person. Explained the limitations in evaluation and management via telemedicine. Patient is aware of potential medical charges for this visit.  Patient consented to this virtual visit.  The persons participating in this telemedicine service were myself and the patient  Chief complaint:    HPI: Last office visit February 19, 2018 EGD and colonoscopy March 09, 2018 showed gastritis, biopsies negative for H. pylori.  Colon and terminal ileum appeared normal. Capsule endoscopy April 01, 2018 showed patchy erythema and small superficial erosions scattered in the small bowel  She is taking Protonix daily with improvement of epigastric abdominal pain and nausea.  Continues to have intermittent abdominal cramping and increased bowel frequency with 3-6 bowel movements daily.  Denies any melena or rectal bleeding. She is avoiding taking NSAIDs daily.  Drinks multiple cups of coffee throughout the day. Started taking Entocort 2 weeks ago, has not noticed any significant improvement in her symptoms.  Previous HPI: 43 year old female, her husband is a physician (Dr Ander Slade) with PCCM here to establish GI care.  She previously had EGD and colonoscopy in 2013 and 2018, was diagnosed with nonspecific colitis and also ?  Crohn's disease.  She was advised to start Humira but never started because she was concerned about being on immunosuppressive therapy as she was around patients with TB and fungal infections at her job. Her symptoms were stable but since they moved here in the past few months she is having worsening  nausea associated with abdominal bloating/discomfort.  She also has multiple small bowel movements ranging from dislodged to watery diarrhea during the daytime.  Denies any nocturnal symptoms.  Stool is dark but no visible blood. She tried lactose-free and gluten-free diet with no significant improvement. Denies any weight loss, but has decreased appetite, fatigue, generalized joint pain (especially small and medium joints) and back pain.  No rash. She was on Apriso in the past, currently not on any maintenance therapy  Takes ibuprofen almost daily for joint pain.  Also is taking low-dose aspirin  Outpatient Encounter Medications as of 05/20/2018  Medication Sig  . aspirin EC 81 MG tablet Take 81 mg by mouth daily.  . budesonide (ENTOCORT EC) 3 MG 24 hr capsule Take 3 capsules (9 mg total) by mouth daily.  . cholecalciferol (VITAMIN D) 1000 units tablet Take 2,000 Units by mouth daily.  . fexofenadine-pseudoephedrine (ALLEGRA-D 24) 180-240 MG 24 hr tablet Take 1 tablet by mouth daily.  . fluticasone (FLONASE) 50 MCG/ACT nasal spray Place into both nostrils daily.  Marland Kitchen levothyroxine (SYNTHROID, LEVOTHROID) 200 MCG tablet Take 1 tablet (200 mcg total) by mouth daily before breakfast.  . omeprazole (PRILOSEC) 40 MG capsule Take 1 capsule (40 mg total) by mouth 2 (two) times daily.  . TURMERIC PO Take 1,000 mg by mouth.   Facility-Administered Encounter Medications as of 05/20/2018  Medication  . 0.9 %  sodium chloride infusion    Allergies as of 05/20/2018 - Review Complete 05/19/2018  Allergen Reaction Noted  . Dilaudid [hydromorphone hcl] Anaphylaxis 08/28/2017    Past Medical  History:  Diagnosis Date  . Acquired hypothyroidism 08/28/2017  . Allergy   . Arthritis   . Crohn disease ([place])   . GERD (gastroesophageal reflux disease)     Past Surgical History:  Procedure Laterality Date  . THYROIDECTOMY  2015   benign nodules; done prophylactially due to strong FH of thyroid cancer in mom  and GM  . TUBAL LIGATION      Family History  Problem Relation Age of Onset  . Rheum arthritis Mother   . Diverticulitis Mother   . Hypertension Mother   . Obesity Mother   . Thyroid cancer Mother   . Non-Hodgkin's lymphoma Father   . Rheum arthritis Brother   . Obesity Brother   . Healthy Daughter   . Healthy Son   . Thyroid cancer Maternal Grandfather   . Healthy Daughter     Social History   Socioeconomic History  . Marital status: Married    Spouse name: Not on file  . Number of children: 3  . Years of education: Not on file  . Highest education level: Not on file  Occupational History  . Occupation: Forensic scientist  Social Needs  . Financial resource strain: Not on file  . Food insecurity:    Worry: Not on file    Inability: Not on file  . Transportation needs:    Medical: Not on file    Non-medical: Not on file  Tobacco Use  . Smoking status: Former Research scientist (life sciences)  . Smokeless tobacco: Never Used  . Tobacco comment: quit 2003  Substance and Sexual Activity  . Alcohol use: Yes    Comment: occasionally   . Drug use: Never  . Sexual activity: Yes    Birth control/protection: Surgical    Comment: tubal  Lifestyle  . Physical activity:    Days per week: Not on file    Minutes per session: Not on file  . Stress: Not on file  Relationships  . Social connections:    Talks on phone: Not on file    Gets together: Not on file    Attends religious service: Not on file    Active member of club or organization: Not on file    Attends meetings of clubs or organizations: Not on file    Relationship status: Not on file  . Intimate partner violence:    Fear of current or ex partner: Not on file    Emotionally abused: Not on file    Physically abused: Not on file    Forced sexual activity: Not on file  Other Topics Concern  . Not on file  Social History Narrative   Husband is pulmonologist: joined [facility] 2019      Review of systems: Review of Systems as per  HPI All other systems reviewed and are negative.   Physical Exam: Vitals were not taken and physical exam was not performed during this virtual visit.  Data Reviewed:  Reviewed labs, radiology imaging, old records and pertinent past GI work up   Assessment and Plan/Recommendations:  43 year old female with chronic GERD, irritable bowel syndrome and ?  Crohn's disease Based on capsule endoscopy has patchy aphthous ulcers in the small bowel could be secondary to NSAID use but cannot exclude mild Crohn's disease  We will continue Entocort 9 mg daily for 2 months followed by taper over a month. We will hold off starting Biologics for now. If continues to have persistent symptoms, will consider checking serologic markers for Crohn's  We will  consider repeat small bowel video capsule in a year to see if has any improvement of small bowel mucosa after prolonged abstinence from using frequent NSAIDs  Continue to avoid NSAIDs  Decrease caffeine intake  Continue Protonix and antireflux measures  Follow-up in 4 weeks    K. Denzil Magnuson , MD   CC: Leamon Arnt, MD

## 2018-05-27 ENCOUNTER — Other Ambulatory Visit: Payer: Self-pay | Admitting: Gastroenterology

## 2018-05-27 MED FILL — BUDESONIDE 3 MG CPEP: 3 | 30 days supply | Qty: 90 | Fill #0

## 2018-05-27 MED FILL — LEVOTHYROXINE 200 MCG TAB: 200 | 30 days supply | Qty: 30 | Fill #2

## 2018-06-29 MED FILL — OMEPRAZOLE 40 MG CPDR: 40 | 30 days supply | Qty: 60 | Fill #2

## 2018-06-29 MED FILL — LEVOTHYROXINE 200 MCG TAB: 200 | 30 days supply | Qty: 30 | Fill #3

## 2018-08-10 MED FILL — LEVOTHYROXINE 200 MCG TAB: 200 | 30 days supply | Qty: 30 | Fill #4

## 2018-08-10 MED FILL — OMEPRAZOLE 40 MG CPDR: 40 | 30 days supply | Qty: 60 | Fill #3

## 2018-08-11 ENCOUNTER — Other Ambulatory Visit: Payer: Self-pay

## 2018-08-11 ENCOUNTER — Other Ambulatory Visit: Payer: Self-pay | Admitting: Family Medicine

## 2018-08-11 ENCOUNTER — Encounter: Payer: Self-pay | Admitting: Family Medicine

## 2018-08-11 ENCOUNTER — Ambulatory Visit (INDEPENDENT_AMBULATORY_CARE_PROVIDER_SITE_OTHER): Payer: 59

## 2018-08-11 ENCOUNTER — Ambulatory Visit: Payer: 59 | Admitting: Family Medicine

## 2018-08-11 VITALS — BP 118/84 | HR 83 | Temp 98.0°F | Resp 16 | Ht 67.0 in | Wt 235.2 lb

## 2018-08-11 DIAGNOSIS — K0889 Other specified disorders of teeth and supporting structures: Secondary | ICD-10-CM | POA: Diagnosis not present

## 2018-08-11 DIAGNOSIS — J320 Chronic maxillary sinusitis: Secondary | ICD-10-CM | POA: Diagnosis not present

## 2018-08-11 DIAGNOSIS — M25551 Pain in right hip: Secondary | ICD-10-CM | POA: Diagnosis not present

## 2018-08-11 DIAGNOSIS — R51 Headache: Secondary | ICD-10-CM | POA: Diagnosis not present

## 2018-08-11 MED ORDER — AMOXICILLIN-POT CLAVULANATE 875-125 MG PO TABS: 1.0000 | ORAL_TABLET | Freq: Two times a day (BID) | ORAL | 0 refills | Status: DC

## 2018-08-11 MED FILL — AMOX-CLAV 875-125 MG TABLET: 875-125 | 10 days supply | Qty: 20 | Fill #0

## 2018-08-11 NOTE — Patient Instructions (Addendum)
Please return in December 2020 for your annual complete physical; please come fasting.   I will let you know about your xray results later today or tomorrow.  Take all of the antibiotics.  Tylenol and stretches for hip pain; and then I will refer you to Sports Medicine or Physical therapy.   If you have any questions or concerns, please don't hesitate to send me a message via MyChart or call the office at [phone]. Thank you for visiting with [place] today! It's our pleasure caring for you.   Hip Pain  The hip is the joint between the upper legs and the lower pelvis. The bones, cartilage, tendons, and muscles of your hip joint support your body and allow you to move around. Hip pain can range from a minor ache to severe pain in one or both of your hips. The pain may be felt on the inside of the hip joint near the groin, or the outside near the buttocks and upper thigh. You may also have swelling or stiffness. Follow these instructions at home: Managing pain, stiffness, and swelling  If directed, apply ice to the injured area. ? Put ice in a plastic bag. ? Place a towel between your skin and the bag. ? Leave the ice on for 20 minutes, 2-3 times a day  Sleep with a pillow between your legs on your most comfortable side.  Avoid any activities that cause pain. General instructions  Take over-the-counter and prescription medicines only as told by your health care provider.  Do any exercises as told by your health care provider.  Record the following: ? How often you have hip pain. ? The location of your pain. ? What the pain feels like. ? What makes the pain worse.  Keep all follow-up visits as told by your health care provider. This is important. Contact a health care provider if:  You cannot put weight on your leg.  Your pain or swelling continues or gets worse after one week.  It gets harder to walk.  You have a fever. Get help right away if:  You fall.  You have a  sudden increase in pain and swelling in your hip.  Your hip is red or swollen or very tender to touch. Summary  Hip pain can range from a minor ache to severe pain in one or both of your hips.  The pain may be felt on the inside of the hip joint near the groin, or the outside near the buttocks and upper thigh.  Avoid any activities that cause pain.  Record how often you have hip pain, the location of the pain, what makes it worse and what it feels like. This information is not intended to replace advice given to you by your health care provider. Make sure you discuss any questions you have with your health care provider. Document Released: 06/20/2009 Document Revised: 12/13/2016 Document Reviewed: 12/04/2015 Elsevier Patient Education  2020 [employer].
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
## 2018-09-07 MED FILL — OMEPRAZOLE 40 MG CPDR: 40 | 30 days supply | Qty: 60 | Fill #4

## 2018-09-07 MED FILL — LEVOTHYROXINE 200 MCG TAB: 200 | 30 days supply | Qty: 30 | Fill #5

## 2018-09-14 ENCOUNTER — Encounter: Payer: Self-pay | Admitting: Family Medicine

## 2018-09-14 ENCOUNTER — Other Ambulatory Visit: Payer: 59

## 2018-09-14 ENCOUNTER — Other Ambulatory Visit: Payer: Self-pay

## 2018-09-14 ENCOUNTER — Ambulatory Visit (INDEPENDENT_AMBULATORY_CARE_PROVIDER_SITE_OTHER): Payer: 59 | Admitting: Family Medicine

## 2018-09-14 VITALS — Wt 230.0 lb

## 2018-09-14 DIAGNOSIS — R399 Unspecified symptoms and signs involving the genitourinary system: Secondary | ICD-10-CM | POA: Diagnosis not present

## 2018-09-14 LAB — POC URINALSYSI DIPSTICK (AUTOMATED)
Bilirubin, UA: NEGATIVE
Glucose, UA: NEGATIVE
Ketones, UA: NEGATIVE
Leukocytes, UA: NEGATIVE
Nitrite, UA: NEGATIVE
Protein, UA: NEGATIVE
Spec Grav, UA: 1.02 (ref 1.010–1.025)
Urobilinogen, UA: 0.2 E.U./dL
pH, UA: 6 (ref 5.0–8.0)

## 2018-09-14 MED ORDER — NITROFURANTOIN MONOHYD MACRO 100 MG PO CAPS: 100.0000 mg | ORAL_CAPSULE | Freq: Two times a day (BID) | ORAL | 0 refills | Status: DC

## 2018-09-14 MED FILL — NITROFURANTOIN MONO-MCR 100: 100 | 5 days supply | Qty: 10 | Fill #0
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
## 2018-09-14 NOTE — Progress Notes (Signed)
yll

## 2018-09-15 ENCOUNTER — Encounter: Payer: Self-pay | Admitting: Family Medicine

## 2018-09-16 LAB — URINE CULTURE
MICRO NUMBER:: 830331
SPECIMEN QUALITY:: ADEQUATE

## 2018-09-24 ENCOUNTER — Other Ambulatory Visit: Payer: 59

## 2018-09-24 ENCOUNTER — Other Ambulatory Visit: Payer: Self-pay

## 2018-09-24 DIAGNOSIS — R1013 Epigastric pain: Secondary | ICD-10-CM

## 2018-09-24 DIAGNOSIS — R197 Diarrhea, unspecified: Secondary | ICD-10-CM

## 2018-09-25 ENCOUNTER — Other Ambulatory Visit: Payer: Self-pay

## 2018-09-25 ENCOUNTER — Other Ambulatory Visit: Payer: 59

## 2018-09-25 DIAGNOSIS — R1013 Epigastric pain: Secondary | ICD-10-CM

## 2018-09-25 DIAGNOSIS — R197 Diarrhea, unspecified: Secondary | ICD-10-CM

## 2018-09-25 MED ORDER — BUDESONIDE 3 MG PO CPEP: 9.0000 mg | ORAL_CAPSULE | Freq: Every day | ORAL | 3 refills | Status: DC

## 2018-09-25 MED FILL — BUDESONIDE 3 MG CAP: 3 | 30 days supply | Qty: 90 | Fill #0

## 2018-09-25 NOTE — Telephone Encounter (Signed)
Called her, she thinks her symptoms are similar to when she had a Crohn's flare.  Worse upper GI with nausea and epigastric discomfort.  Continues to have persistent diarrhea. She started taking some leftover prednisone yesterday and is feeling somewhat better. We will start budesonide 9 mg daily.  Please send prescription for 90 days Await stool C. difficile results Schedule follow-up office visit next available.  Thank you

## 2018-09-26 LAB — CLOSTRIDIUM DIFFICILE BY PCR: Toxigenic C. Difficile by PCR: NEGATIVE

## 2018-10-08 ENCOUNTER — Encounter: Payer: Self-pay | Admitting: Family Medicine

## 2018-10-08 ENCOUNTER — Other Ambulatory Visit: Payer: Self-pay

## 2018-10-08 ENCOUNTER — Ambulatory Visit (INDEPENDENT_AMBULATORY_CARE_PROVIDER_SITE_OTHER): Payer: 59

## 2018-10-08 DIAGNOSIS — Z23 Encounter for immunization: Secondary | ICD-10-CM

## 2018-10-15 ENCOUNTER — Ambulatory Visit: Payer: 59 | Admitting: Gastroenterology

## 2018-10-16 ENCOUNTER — Ambulatory Visit: Payer: 59 | Admitting: Family Medicine

## 2018-10-19 MED FILL — LEVOTHYROXINE 200 MCG TAB: 200 | 30 days supply | Qty: 30 | Fill #6

## 2018-11-19 MED FILL — LEVOTHYROXINE 200 MCG TAB: 200 | 30 days supply | Qty: 30 | Fill #7

## 2018-11-25 ENCOUNTER — Other Ambulatory Visit: Payer: Self-pay

## 2018-11-25 ENCOUNTER — Other Ambulatory Visit (INDEPENDENT_AMBULATORY_CARE_PROVIDER_SITE_OTHER): Payer: 59

## 2018-11-25 ENCOUNTER — Encounter: Payer: Self-pay | Admitting: Gastroenterology

## 2018-11-25 ENCOUNTER — Ambulatory Visit (INDEPENDENT_AMBULATORY_CARE_PROVIDER_SITE_OTHER): Payer: 59 | Admitting: Gastroenterology

## 2018-11-25 VITALS — BP 118/64 | HR 72 | Temp 97.6°F | Ht 68.0 in | Wt 234.6 lb

## 2018-11-25 DIAGNOSIS — R197 Diarrhea, unspecified: Secondary | ICD-10-CM | POA: Diagnosis not present

## 2018-11-25 DIAGNOSIS — K5000 Crohn's disease of small intestine without complications: Secondary | ICD-10-CM

## 2018-11-25 DIAGNOSIS — R140 Abdominal distension (gaseous): Secondary | ICD-10-CM | POA: Diagnosis not present

## 2018-11-25 DIAGNOSIS — K5090 Crohn's disease, unspecified, without complications: Secondary | ICD-10-CM | POA: Diagnosis not present

## 2018-11-25 LAB — CBC WITH DIFFERENTIAL/PLATELET
Basophils Absolute: 0.1 10*3/uL (ref 0.0–0.1)
Basophils Relative: 1 % (ref 0.0–3.0)
Eosinophils Absolute: 0.2 10*3/uL (ref 0.0–0.7)
Eosinophils Relative: 2.8 % (ref 0.0–5.0)
HCT: 41.3 % (ref 36.0–46.0)
Hemoglobin: 14 g/dL (ref 12.0–15.0)
Lymphocytes Relative: 25.3 % (ref 12.0–46.0)
Lymphs Abs: 2.1 10*3/uL (ref 0.7–4.0)
MCHC: 33.8 g/dL (ref 30.0–36.0)
MCV: 89.2 fl (ref 78.0–100.0)
Monocytes Absolute: 0.7 10*3/uL (ref 0.1–1.0)
Monocytes Relative: 8.1 % (ref 3.0–12.0)
Neutro Abs: 5.1 10*3/uL (ref 1.4–7.7)
Neutrophils Relative %: 62.8 % (ref 43.0–77.0)
Platelets: 265 10*3/uL (ref 150.0–400.0)
RBC: 4.63 Mil/uL (ref 3.87–5.11)
RDW: 14.2 % (ref 11.5–15.5)
WBC: 8.2 10*3/uL (ref 4.0–10.5)

## 2018-11-25 LAB — COMPREHENSIVE METABOLIC PANEL
ALT: 15 U/L (ref 0–35)
AST: 13 U/L (ref 0–37)
Albumin: 4.1 g/dL (ref 3.5–5.2)
Alkaline Phosphatase: 73 U/L (ref 39–117)
BUN: 21 mg/dL (ref 6–23)
CO2: 22 mEq/L (ref 19–32)
Calcium: 9.7 mg/dL (ref 8.4–10.5)
Chloride: 105 mEq/L (ref 96–112)
Creatinine, Ser: 0.79 mg/dL (ref 0.40–1.20)
GFR: 80.23 mL/min (ref >60.00)
Glucose, Bld: 92 mg/dL (ref 70–99)
Potassium: 4 mEq/L (ref 3.5–5.1)
Sodium: 135 mEq/L (ref 135–145)
Total Bilirubin: 0.4 mg/dL (ref 0.2–1.2)
Total Protein: 7.3 g/dL (ref 6.0–8.3)

## 2018-11-25 LAB — FOLATE: Folate: >24.1 ng/mL (ref >5.9)

## 2018-11-25 LAB — VITAMIN B12: Vitamin B-12: 452 pg/mL (ref 211–911)

## 2018-11-25 LAB — HIGH SENSITIVITY CRP: CRP, High Sensitivity: 2.46 mg/L (ref 0.000–5.000)

## 2018-11-25 LAB — IGA: IgA: 275 mg/dL (ref 68–378)

## 2018-11-25 MED ORDER — BUDESONIDE 3 MG PO CPEP: 9.0000 mg | ORAL_CAPSULE | Freq: Every day | ORAL | 3 refills | Status: DC

## 2018-11-25 MED FILL — BUDESONIDE 3 MG CAP: 3 | 30 days supply | Qty: 90 | Fill #0

## 2018-11-25 NOTE — Patient Instructions (Signed)
Go to the basement for labs today  We will send your Budesonide refills to your pharmacy  Follow up in 4 months  If you are age 41 or older, your body mass index should be between 23-30. Your Body mass index is 35.67 kg/m. If this is out of the aforementioned range listed, please consider follow up with your Primary Care Provider.  If you are age 43 or younger, your body mass index should be between 19-25. Your Body mass index is 35.67 kg/m. If this is out of the aformentioned range listed, please consider follow up with your Primary Care Provider.    I appreciate the  opportunity to care for you  Thank You   Harl Bowie , MD

## 2018-11-25 NOTE — Progress Notes (Signed)
Sophia Blackwell    YU:[id number]    [birth year]  Primary Care Physician:Andy, Karie Fetch, MD  Referring Physician: Leamon Arnt, [address],  [place] [id number]   Chief complaint: Crohn's disease HPI:  She is doing overall better on budesonide 9 mg daily.  She is avoiding NSAIDs, trying to limit use but she continues to take them occasionally for migraine headaches.  GERD symptoms under control with omeprazole daily.  She is having 3-4 bowel movements daily, is her baseline.  Denies any melena or blood per rectum.  No abdominal pain.  No dysphagia, odynophagia, nausea or vomiting.  Since she started taking oral collagen supplements joint pains have improved.  She is also exercising regularly, has taken up tennis recently and is doing well overall.  GI history :  EGD and colonoscopy March 09, 2018 showed gastritis, biopsies negative for H. pylori.  Colon and terminal ileum appeared normal. Capsule endoscopy April 01, 2018 showed patchy erythema and small superficial erosions scattered in the small bowel.  Maternal grandfather had ulcerative colitis  She previously had EGD and colonoscopy in 2013 and 2018, was diagnosed with nonspecific colitis and also?Crohn's disease.She was advised to start Humira but never started because she was concerned about being on immunosuppressive therapy as she was around patients with TB and fungal infections at her job.  Outpatient Encounter Medications as of 11/25/2018  Medication Sig  . budesonide (ENTOCORT EC) 3 MG 24 hr capsule Take 3 capsules (9 mg total) by mouth daily.  . cholecalciferol (VITAMIN D) 1000 units tablet Take 2,000 Units by mouth daily.  . fexofenadine-pseudoephedrine (ALLEGRA-D 24) 180-240 MG 24 hr tablet Take 1 tablet by mouth daily.  . fluticasone (FLONASE) 50 MCG/ACT nasal spray Place into both nostrils daily.  Marland Kitchen levothyroxine (SYNTHROID, LEVOTHROID) 200 MCG tablet Take 1 tablet (200 mcg total)  by mouth daily before breakfast.  . nitrofurantoin, macrocrystal-monohydrate, (MACROBID) 100 MG capsule Take 1 capsule (100 mg total) by mouth 2 (two) times daily.  Marland Kitchen omeprazole (PRILOSEC) 40 MG capsule Take 1 capsule (40 mg total) by mouth 2 (two) times daily.  . TURMERIC PO Take 1,000 mg by mouth.   No facility-administered encounter medications on file as of 11/25/2018.     Allergies as of 11/25/2018 - Review Complete 11/25/2018  Allergen Reaction Noted  . Dilaudid [hydromorphone hcl] Anaphylaxis 08/28/2017    Past Medical History:  Diagnosis Date  . Acquired hypothyroidism 08/28/2017  . Allergy   . Arthritis   . Crohn disease ([place])   . GERD (gastroesophageal reflux disease)     Past Surgical History:  Procedure Laterality Date  . THYROIDECTOMY  2015   benign nodules; done prophylactially due to strong FH of thyroid cancer in mom and GM  . TUBAL LIGATION      Family History  Problem Relation Age of Onset  . Rheum arthritis Mother   . Diverticulitis Mother   . Hypertension Mother   . Obesity Mother   . Thyroid cancer Mother   . Non-Hodgkin's lymphoma Father   . Rheum arthritis Brother   . Obesity Brother   . Healthy Daughter   . Healthy Son   . Thyroid cancer Maternal Grandfather   . Healthy Daughter     Social History   Socioeconomic History  . Marital status: Married    Spouse name: Not on file  . Number of children: 3  . Years of education: Not on  file  . Highest education level: Not on file  Occupational History  . Occupation: Forensic scientist  Social Needs  . Financial resource strain: Not on file  . Food insecurity    Worry: Not on file    Inability: Not on file  . Transportation needs    Medical: Not on file    Non-medical: Not on file  Tobacco Use  . Smoking status: Former Research scientist (life sciences)  . Smokeless tobacco: Never Used  . Tobacco comment: quit 2003  Substance and Sexual Activity  . Alcohol use: Yes    Comment: occasionally   . Drug use: Never   . Sexual activity: Yes    Birth control/protection: Surgical    Comment: tubal  Lifestyle  . Physical activity    Days per week: Not on file    Minutes per session: Not on file  . Stress: Not on file  Relationships  . Social Herbalist on phone: Not on file    Gets together: Not on file    Attends religious service: Not on file    Active member of club or organization: Not on file    Attends meetings of clubs or organizations: Not on file    Relationship status: Not on file  . Intimate partner violence    Fear of current or ex partner: Not on file    Emotionally abused: Not on file    Physically abused: Not on file    Forced sexual activity: Not on file  Other Topics Concern  . Not on file  Social History Narrative   Husband is pulmonologist: joined [employer] 2019      Review of systems: Review of Systems  Constitutional: Negative for fever and chills.  HENT: Negative.   Eyes: Negative for blurred vision.  Respiratory: Negative for cough, shortness of breath and wheezing.   Cardiovascular: Negative for chest pain and palpitations.  Gastrointestinal: as per HPI Genitourinary: Negative for dysuria, urgency, frequency and hematuria.  Musculoskeletal: Negative for myalgias, back pain and positive for joint pain.  Skin: Negative for itching and rash.  Neurological: Negative for dizziness, tremors, focal weakness, seizures and loss of consciousness.  Endo/Heme/Allergies: Positive for seasonal allergies.  Psychiatric/Behavioral: Negative for depression, suicidal ideas and hallucinations.  All other systems reviewed and are negative.   Physical Exam: Vitals:   11/25/18 0811  BP: 118/64  Pulse: 72  Temp: 97.6 F (36.4 C)   Body mass index is 35.67 kg/m. Gen:      No acute distress HEENT:  EOMI, sclera anicteric Neck:     No masses; no thyromegaly Lungs:    Clear to auscultation bilaterally; normal respiratory effort CV:         Regular rate and rhythm; no  murmurs Abd:      + bowel sounds; soft, non-tender; no palpable masses, no distension Ext:    No edema; adequate peripheral perfusion Skin:      Warm and dry; no rash Neuro: alert and oriented x 3 Psych: normal mood and affect  Data Reviewed:  Reviewed labs, radiology imaging, old records and pertinent past GI work up   Assessment and Plan/Recommendations: 43 year old female with Crohn's disease of small bowel currently in clinical remission on budesonide. Given mild Crohn's disease, will plan to continue budesonide for 6 to 12 months to maintain clinical remission.  Will consider slow taper after that, will hold off starting chronic immunosuppressive therapy or Biologics if able to maintain remission off budesonide. Crohn's disease was  likely exacerbated due to excessive use of NSAIDs in the past Continue to avoid/limit use of NSAIDs Check CBC, CMP, CRP, folate, B12.  We will also check TTG IgA antibody to exclude celiac disease  GERD: Continue omeprazole and antireflux measures  Return in 4 months or sooner if needed  25 minutes was spent face-to-face with the patient. Greater than 50% of the time used for counseling as well as treatment plan and follow-up. She had multiple questions which were answered to her satisfaction  K. Denzil Magnuson , MD    CC: Leamon Arnt, MD

## 2018-11-27 ENCOUNTER — Encounter: Payer: Self-pay | Admitting: Gastroenterology

## 2018-12-05 ENCOUNTER — Ambulatory Visit (INDEPENDENT_AMBULATORY_CARE_PROVIDER_SITE_OTHER)
Admission: RE | Admit: 2018-12-05 | Discharge: 2018-12-05 | Disposition: A | Discharge status: Home / Self Care | Payer: 59 | Source: Ambulatory Visit

## 2018-12-05 DIAGNOSIS — H1032 Unspecified acute conjunctivitis, left eye: Secondary | ICD-10-CM | POA: Diagnosis not present

## 2018-12-05 MED ORDER — POLYMYXIN B-TRIMETHOPRIM 10000-0.1 UNIT/ML-% OP SOLN: 2.0000 [drp] | OPHTHALMIC | 0 refills | Status: DC

## 2018-12-05 MED FILL — POLYMYXIN B/TMP EYE DROPS: 10000-0.1 | 10 days supply | Qty: 10 | Fill #0

## 2018-12-05 NOTE — ED Provider Notes (Signed)
Virtual Visit via Video Note:  Sophia Blackwell  initiated request for Telemedicine visit with [facility] team. I connected with Sophia Blackwell  on 12/06/2018 at 8:17 AM  for a synchronized telemedicine visit using a video enabled HIPPA compliant telemedicine application. I verified that I am speaking with Sophia Blackwell  using two identifiers. [place] Hall-Potvin, PA-C  was physically located in a [place] [facility] site and Sharmel Beitel was located at a different location.   The limitations of evaluation and management by telemedicine as well as the availability of in-person appointments were discussed. Patient was informed that she  may incur a bill ( including co-pay) for this virtual visit encounter. Sophia Blackwell  expressed understanding and gave verbal consent to proceed with virtual visit.     History of Present Illness:Sophia Blackwell  is a 43 y.o. female presents with left eye irritation, erythema, crusting, mucoid discharge.  Patient has a history of severe allergies that have been worse since moving to [place].  States both eyes have been itching for the last month: Has been using Flonase, Allegra, Pataday ophthalmic drops with moderate relief.  Patient thinks that her left eye may become infected second to rubbing them a lot.  Patient does not wear contacts or glasses, has not had change in vision, fever, foreign body sensation/exposure, photophobia.  Patient did have crusting over her eyelid this morning with medial irritation and mucoid discharge.  Review of Systems  Constitutional: Negative for fever and malaise/fatigue.  HENT: Negative for congestion, ear pain and sore throat.   Eyes: Positive for discharge and redness. Negative for blurred vision, double vision, photophobia and pain.  Respiratory: Negative for cough and shortness of breath.   Cardiovascular: Negative for chest pain and palpitations.  Gastrointestinal: Negative for abdominal pain, diarrhea  and vomiting.    Past Medical History:  Diagnosis Date  . Acquired hypothyroidism 08/28/2017  . Allergy   . Arthritis   . Crohn disease ([place])   . GERD (gastroesophageal reflux disease)     Allergies  Allergen Reactions  . Dilaudid [Hydromorphone Hcl] Anaphylaxis        Observations/Objective:  43 y.o. female Sitting in no acute distress.  Patient is able to speak in full sentences without coughing, sneezing, wheezing.  Assessment and Plan: H&P consistent with bacterial conjunctivitis second to chronic allergic conjunctivitis.  Will start Polytrim drops today.  Return precautions discussed, patient verbalized understanding and is agreeable to plan.  Follow Up Instructions: Patient to seek in person evaluation for persistent, worsening symptoms.   I discussed the assessment and treatment plan with the patient. The patient was provided an opportunity to ask questions and all were answered. The patient agreed with the plan and demonstrated an understanding of the instructions.   The patient was advised to call back or seek an in-person evaluation if the symptoms worsen or if the condition fails to improve as anticipated.  I provided 15 minutes of non-face-to-face time during this encounter.    Naomi, PA-C  12/06/2018 8:17 AM        Hall-Potvin, [place], [place] 12/06/18 [phone]

## 2018-12-05 NOTE — Discharge Instructions (Signed)
Use eyedrops as directed. Important wash hands, do not let dropper touch eyelashes or eyes this will ruin sterility. Follow-up with PCP for persistent, worsening symptoms. Return sooner if you develop fever, change in vision, photophobia.

## 2018-12-09 ENCOUNTER — Encounter: Payer: Self-pay | Admitting: Family Medicine

## 2018-12-09 ENCOUNTER — Other Ambulatory Visit: Payer: Self-pay

## 2018-12-09 MED ORDER — FLUTICASONE PROPIONATE 50 MCG/ACT NA SUSP: 1.0000 | Freq: Every day | NASAL | 1 refills | Status: AC

## 2018-12-09 MED FILL — FLUTICASONE PROP 50 MCG SPR: 50 | 60 days supply | Qty: 16 | Fill #0

## 2018-12-17 MED FILL — LEVOTHYROXINE 200 MCG TAB: 200 | 30 days supply | Qty: 30 | Fill #8

## 2018-12-28 ENCOUNTER — Encounter: Payer: 59 | Admitting: Family Medicine

## 2018-12-31 ENCOUNTER — Encounter: Payer: 59 | Admitting: Family Medicine

## 2019-01-16 ENCOUNTER — Other Ambulatory Visit: Payer: Self-pay | Admitting: Family Medicine

## 2019-01-16 MED FILL — BUDESONIDE 3 MG CAP: 3 | 30 days supply | Qty: 90 | Fill #1

## 2019-01-18 MED FILL — LEVOTHYROXINE SODIUM 200 MC: 200 | 30 days supply | Qty: 30 | Fill #0

## 2019-01-20 ENCOUNTER — Other Ambulatory Visit: Payer: Self-pay

## 2019-01-21 ENCOUNTER — Encounter: Payer: Self-pay | Admitting: Family Medicine

## 2019-01-21 ENCOUNTER — Ambulatory Visit (INDEPENDENT_AMBULATORY_CARE_PROVIDER_SITE_OTHER): Payer: Self-pay | Admitting: Family Medicine

## 2019-01-21 VITALS — BP 132/88 | HR 82 | Temp 98.1°F | Ht 68.0 in | Wt 249.4 lb

## 2019-01-21 DIAGNOSIS — Z0000 Encounter for general adult medical examination without abnormal findings: Secondary | ICD-10-CM

## 2019-01-21 DIAGNOSIS — K5000 Crohn's disease of small intestine without complications: Secondary | ICD-10-CM | POA: Insufficient documentation

## 2019-01-21 DIAGNOSIS — K5090 Crohn's disease, unspecified, without complications: Secondary | ICD-10-CM

## 2019-01-21 DIAGNOSIS — E669 Obesity, unspecified: Secondary | ICD-10-CM

## 2019-01-21 DIAGNOSIS — E890 Postprocedural hypothyroidism: Secondary | ICD-10-CM

## 2019-01-21 LAB — LIPID PANEL
Cholesterol: 200 mg/dL (ref 0–200)
HDL: 58.5 mg/dL (ref >39.00)
LDL Cholesterol: 123 mg/dL — ABNORMAL HIGH (ref 0–99)
NonHDL: 141.55
Total CHOL/HDL Ratio: 3
Triglycerides: 92 mg/dL (ref 0.0–149.0)
VLDL: 18.4 mg/dL (ref 0.0–40.0)

## 2019-01-21 LAB — TSH: TSH: 2.13 u[IU]/mL (ref 0.35–4.50)
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
## 2019-01-21 NOTE — Patient Instructions (Signed)
Please return in 12 months for your annual complete physical; please come fasting.  I will release your lab results to you on your MyChart account with further instructions. Please reply with any questions.   If you have any questions or concerns, please don't hesitate to send me a message via MyChart or call the office at [phone]. Thank you for visiting with us today! It's our pleasure caring for you.  Please do these things to maintain good health!   Exercise at least 30-45 minutes a day,  4-5 days a week.   Eat a low-fat diet with lots of fruits and vegetables, up to 7-9 servings per day.  Drink plenty of water daily. Try to drink 8 8oz glasses per day.  Seatbelts can save your life. Always wear your seatbelt.  Place Smoke Detectors on every level of your home and check batteries every year.  Schedule an appointment with an eye doctor for an eye exam every 1-2 years  Safe sex - use condoms to protect yourself from STDs if you could be exposed to these types of infections. Use birth control if you do not want to become pregnant and are sexually active.  Avoid heavy alcohol use. If you drink, keep it to less than 2 drinks/day and not every day.  Health Care Power of Attorney.  Choose someone you trust that could speak for you if you became unable to speak for yourself.  Depression is common in our stressful world.If you're feeling down or losing interest in things you normally enjoy, please come in for a visit.  If anyone is threatening or hurting you, please get help. Physical or Emotional Violence is never OK.   

## 2019-03-01 MED FILL — LEVOTHYROXINE SODIUM 200 MC: 200 | 30 days supply | Qty: 30 | Fill #1

## 2019-04-06 MED FILL — LEVOTHYROXINE SODIUM 200 MC: 200 | 90 days supply | Qty: 90 | Fill #2

## 2019-05-11 ENCOUNTER — Ambulatory Visit (INDEPENDENT_AMBULATORY_CARE_PROVIDER_SITE_OTHER): Payer: 59 | Admitting: Family Medicine

## 2019-05-11 ENCOUNTER — Encounter: Payer: Self-pay | Admitting: Family Medicine

## 2019-05-11 ENCOUNTER — Ambulatory Visit: Payer: Self-pay

## 2019-05-11 ENCOUNTER — Ambulatory Visit (INDEPENDENT_AMBULATORY_CARE_PROVIDER_SITE_OTHER): Payer: 59

## 2019-05-11 ENCOUNTER — Other Ambulatory Visit: Payer: Self-pay

## 2019-05-11 VITALS — BP 112/78 | HR 78 | Ht 68.0 in | Wt 240.4 lb

## 2019-05-11 DIAGNOSIS — M79671 Pain in right foot: Secondary | ICD-10-CM | POA: Diagnosis not present

## 2019-05-11 DIAGNOSIS — M25562 Pain in left knee: Secondary | ICD-10-CM

## 2019-05-11 DIAGNOSIS — M7731 Calcaneal spur, right foot: Secondary | ICD-10-CM | POA: Diagnosis not present

## 2019-05-11 DIAGNOSIS — G8929 Other chronic pain: Secondary | ICD-10-CM | POA: Diagnosis not present

## 2019-05-11 DIAGNOSIS — M1612 Unilateral primary osteoarthritis, left hip: Secondary | ICD-10-CM | POA: Diagnosis not present

## 2019-05-11 NOTE — Progress Notes (Signed)
Subjective:    CC: L  knee pain and R foot pain  I, Sophia Blackwell, LAT, ATC, am serving as scribe for Dr. Lynne Leader.  HPI: Pt is a 44 y/o female presenting w/ c/o L knee pain x one year that has worsened over the past 3 months.  She locates her pain to L anterior knee .  She rates her pain as mild at rest and 7/10 at it's worst and describes her pain as a sharp, aching pain.  Pain is worse with activity including playing tennis.  Radiating pain: No L knee swelling: Yes L knee mechanical symptoms: yes some popping and "crunching" in her L knee Aggravating factors:  Tennis; stairs; full knee flexion; kneeling Treatments tried: ice; Voltaren gel; can't take NSAIDs  R lateral foot pain x one year w/ no known MOI.  Worse w/ tennis.  She has tried ice.  No acute injury.  No significant change in activity.   Pertinent review of Systems: Fevers or chills  Relevant historical information: Crohn's disease.  Patient is a Designer, jewellery.   Objective:    Vitals:   05/11/19 1430  BP: 112/78  Pulse: 78  SpO2: 98%   General: Well Developed, well nourished, and in no acute distress.   MSK: Left knee normal-appearing without effusion. Range of motion 0-120 degrees with crepitation. Minimally tender palpation anterior lateral joint line. Stable ligamentous exam. Negative McMurray's test. Intact strength.  Right foot normal-appearing Normal motion. Mildly tender palpation proximal fifth metatarsal and along course of peroneal tendon at midfoot into lateral ankle. Some pain with resisted ankle eversion. Strength is intact. Stable ligamentous exam. Pulses cap refill and sensation are intact distally.  Lab and Radiology Results  X-ray images right foot and left knee obtained today personally and independently reviewed  Right foot: No acute fractures.  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  Await formal radiology review  Diagnostic Limited MSK  Ultrasound of: Left knee Quad tendon intact normal-appearing trace joint effusion present. Patellar tendon normal. Lateral joint line narrowed without obvious tear. Medial joint line also narrowed without obvious meniscus tear. Impression: Mild DJD  Diagnostic Limited MSK Ultrasound of: Lateral right foot and ankle Peroneal tendon at midfoot with hypoechoic change surrounding tendon sheath.  No obvious tendon defect present. Intact at insertion onto proximal fifth metatarsal. Fifth metatarsal visualized without cortical defect. Impression: Peroneal tendinitis     Impression and Recommendations:    Assessment and Plan: 44 y.o. female with left knee pain ongoing for months.  Patient has mild degenerative changes and describes pain and swelling following tennis.  She likely is developing effusions due to exercise and exacerbation of DJD.  Discussed pragmatic initial treatment management strategies.  Plan for Voltaren gel and compressive knee sleeve during and following activity.  If not improving reasonable to proceed with injection.  Recheck back as needed.   Right foot pain: Patient has lateral foot pain and lateral ankle pain.  Dominant finding today is peroneal tendinitis on ultrasound and physical exam.  Plan for home exercise teaching as well as Voltaren gel and compressive ankle sleeve.  Recheck back if needed. Exercises taught by ATC in clinic today.   Orders Placed This Encounter  Procedures  . [place] LIMITED JOINT SPACE STRUCTURES LOW BILAT(NO LINKED CHARGES)    Order Specific Question:   Reason for Exam (SYMPTOM  OR DIAGNOSIS REQUIRED)    Answer:   L knee pain    Order Specific Question:  Preferred imaging location?    Answer:   [facility]  . DG Knee AP/LAT W/Sun[facility] Left    Standing Status:   Future    Number of Occurrences:   1    Standing Expiration Date:   07/10/2020    Order Specific Question:   Reason for Exam (SYMPTOM  OR DIAGNOSIS REQUIRED)      Answer:   eval left knee pain    Order Specific Question:   Is patient pregnant?    Answer:   No    Order Specific Question:   Preferred imaging location?    Answer:   [facility]    Order Specific Question:   Radiology Contrast Protocol - do NOT remove file path    Answer:   \\[email or web address]s.pdf  . DG Foot Complete Right    Standing Status:   Future    Number of Occurrences:   1    Standing Expiration Date:   07/10/2020    Order Specific Question:   Reason for Exam (SYMPTOM  OR DIAGNOSIS REQUIRED)    Answer:   eval right foot pain    Order Specific Question:   Is patient pregnant?    Answer:   No    Order Specific Question:   Preferred imaging location?    Answer:   [facility]    Order Specific Question:   Radiology Contrast Protocol - do NOT remove file path    Answer:   \\[email or web address]\Radiant\DXFluoroContrastProtocols.pdf   No orders of the defined types were placed in this encounter.   Discussed warning signs or symptoms. Please see discharge instructions. Patient expresses understanding.   The above documentation has been reviewed and is accurate and complete Lynne Leader

## 2019-05-11 NOTE — Patient Instructions (Addendum)
Thank you for coming in today. Get xrays today.  Use the compression sleeve on the knee during activity and for 30-60 mins following activity.  Get foot xray Do peroneal tendon rehab exercises.  Use voltaren gel up to 4x daily for both.  Recheck in a few weeks if not improving.  Could do more including injection and advanced imaging like MRI.   Body Helix Full knee sleeve.   I recommend you obtained a compression sleeve to help with your joint problems. There are many options on the market however I recommend obtaining a knee Body Helix compression sleeve.  You can find information (including how to appropriate measure yourself for sizing) can be found at www.Body http://www.lambert.com/.  Many of these products are health savings account (HSA) eligible.   You can use the compression sleeve at any time throughout the day but is most important to use while being active as well as for 2 hours post-activity.   It is appropriate to ice following activity with the compression sleeve in place.  Please perform the exercise program that we have prepared for you and gone over in detail on a daily basis.  In addition to the handout you were provided you can access your program through: [email or web address]-exercise-code.com   Your unique program code is: : [id number]    Peroneal Tendinopathy Rehab Ask your health care provider which exercises are safe for you. Do exercises exactly as told by your health care provider and adjust them as directed. It is normal to feel mild stretching, pulling, tightness, or discomfort as you do these exercises. Stop right away if you feel sudden pain or your pain gets worse. Do not begin these exercises until told by your health care provider. Stretching and range-of-motion exercises These exercises warm up your muscles and joints and improve the movement and flexibility of your ankle. These exercises also help to relieve pain and stiffness. Gastroc and soleus stretch, standing This is an exercise  in which you stand on a step and use your body weight to stretch your calf muscles. To do this exercise: 1. Stand on the edge of a step on the ball of your left / right foot. The ball of your foot is on the walking surface, right under your toes. 2. Keep your other foot firmly on the same step. 3. Hold on to the wall, a railing, or a chair for balance. 4. Slowly lift your other foot, allowing your body weight to press your left / right heel down over the edge of the step. You should feel a stretch in your left / right calf (gastrocnemius and soleus). 5. Hold this position for __________ seconds. 6. Return both feet to the step. 7. Repeat this exercise with a slight bend in your left / right knee. Repeat __________ times with your left / right knee straight and __________ times with your left / right knee bent. Complete this exercise __________ times a day. Strengthening exercises These exercises build strength and endurance in your foot and ankle. Endurance is the ability to use your muscles for a long time, even after they get tired. Ankle dorsiflexion with band  1. Secure a rubber exercise band or tube to an object, such as a table leg, that will not move when the band is pulled. 2. Secure the other end of the band around your left / right foot. 3. Sit on the floor, facing the object with your left / right leg extended. The band or tube should be  slightly tense when your foot is relaxed. 4. Slowly flex your left / right ankle and toes to bring your foot toward you (dorsiflexion). 5. Hold this position for __________ seconds. 6. Let the band or tube slowly pull your foot back to the starting position. Repeat __________ times. Complete this exercise __________ times a day. Ankle eversion 1. Sit on the floor with your legs straight out in front of you. 2. Loop a rubber exercise band or tube around the ball of your left / right foot. The ball of your foot is on the walking surface, right under  your toes. 3. Hold the ends of the band in your hands, or secure the band to a stable object. The band or tube should be slightly tense when your foot is relaxed. 4. Slowly push your foot outward, away from your other leg (eversion). 5. Hold this position for __________ seconds. 6. Slowly return your foot to the starting position. Repeat __________ times. Complete this exercise __________ times a day. Plantar flexion, standing This exercise is sometimes called standing heel raise. 1. Stand with your feet shoulder-width apart. 2. Place your hands on a wall or table to steady yourself as needed, but try not to use it for support. 3. Keep your weight spread evenly over the width of your feet while you slowly rise up on your toes (plantar flexion). If told by your health care provider: ? Shift your weight toward your left / right leg until you feel challenged. ? Stand on your left / right leg only. 4. Hold this position for __________ seconds. Repeat __________ times. Complete this exercise __________ times a day. Single leg stand 1. Without shoes, stand near a railing or in a doorway. You may hold on to the railing or door frame as needed. 2. Stand on your left / right foot. Keep your big toe down on the floor and try to keep your arch lifted. ? Do not roll to the outside of your foot. ? If this exercise is too easy, you can try it with your eyes closed or while standing on a pillow. 3. Hold this position for __________ seconds. Repeat __________ times. Complete this exercise __________ times a day. This information is not intended to replace advice given to you by your health care provider. Make sure you discuss any questions you have with your health care provider. Document Revised: 04/21/2018 Document Reviewed: 04/21/2018 Elsevier Patient Education  [place].

## 2019-05-12 NOTE — Progress Notes (Signed)
X-ray knee shows some arthritis no acute fractures.  Kneecap looks like it is trying to push more laterally which could be a source of pain.  Quad strengthening will help with this.

## 2019-05-12 NOTE — Progress Notes (Signed)
X-ray right foot shows small bunion formation and a heel spur however otherwise looks pretty normal.

## 2019-08-02 MED FILL — LEVOTHYROXINE SODIUM 200 MC: 200 | 90 days supply | Qty: 90 | Fill #3

## 2019-11-23 MED FILL — LEVOTHYROXINE SODIUM 200 MC: 200 | 90 days supply | Qty: 90 | Fill #4

## 2020-01-14 ENCOUNTER — Encounter: Payer: Self-pay | Admitting: Family Medicine

## 2020-01-17 ENCOUNTER — Other Ambulatory Visit: Payer: 59

## 2020-03-08 ENCOUNTER — Other Ambulatory Visit: Payer: Self-pay

## 2020-03-08 ENCOUNTER — Encounter: Payer: Self-pay | Admitting: Family Medicine

## 2020-03-08 ENCOUNTER — Ambulatory Visit (INDEPENDENT_AMBULATORY_CARE_PROVIDER_SITE_OTHER): Payer: 59 | Admitting: Family Medicine

## 2020-03-08 VITALS — BP 124/72 | HR 75 | Temp 98.0°F | Resp 16 | Ht 68.0 in | Wt 213.8 lb

## 2020-03-08 DIAGNOSIS — Z1159 Encounter for screening for other viral diseases: Secondary | ICD-10-CM

## 2020-03-08 DIAGNOSIS — Z0000 Encounter for general adult medical examination without abnormal findings: Secondary | ICD-10-CM | POA: Diagnosis not present

## 2020-03-08 DIAGNOSIS — Z23 Encounter for immunization: Secondary | ICD-10-CM | POA: Diagnosis not present

## 2020-03-08 DIAGNOSIS — Z1231 Encounter for screening mammogram for malignant neoplasm of breast: Secondary | ICD-10-CM

## 2020-03-08 DIAGNOSIS — J309 Allergic rhinitis, unspecified: Secondary | ICD-10-CM

## 2020-03-08 DIAGNOSIS — K5000 Crohn's disease of small intestine without complications: Secondary | ICD-10-CM | POA: Diagnosis not present

## 2020-03-08 DIAGNOSIS — E890 Postprocedural hypothyroidism: Secondary | ICD-10-CM | POA: Diagnosis not present

## 2020-03-08 LAB — LIPID PANEL
Cholesterol: 159 mg/dL (ref 0–200)
HDL: 47.8 mg/dL (ref >39.00)
LDL Cholesterol: 99 mg/dL (ref 0–99)
NonHDL: 111.42
Total CHOL/HDL Ratio: 3
Triglycerides: 64 mg/dL (ref 0.0–149.0)
VLDL: 12.8 mg/dL (ref 0.0–40.0)

## 2020-03-08 LAB — COMPREHENSIVE METABOLIC PANEL
ALT: 21 U/L (ref 0–35)
AST: 16 U/L (ref 0–37)
Albumin: 4.2 g/dL (ref 3.5–5.2)
Alkaline Phosphatase: 50 U/L (ref 39–117)
BUN: 19 mg/dL (ref 6–23)
CO2: 27 mEq/L (ref 19–32)
Calcium: 9.7 mg/dL (ref 8.4–10.5)
Chloride: 102 mEq/L (ref 96–112)
Creatinine, Ser: 0.9 mg/dL (ref 0.40–1.20)
GFR: 79.01 mL/min (ref >60.00)
Glucose, Bld: 87 mg/dL (ref 70–99)
Potassium: 3.7 mEq/L (ref 3.5–5.1)
Sodium: 136 mEq/L (ref 135–145)
Total Bilirubin: 0.6 mg/dL (ref 0.2–1.2)
Total Protein: 7.3 g/dL (ref 6.0–8.3)

## 2020-03-08 LAB — CBC WITH DIFFERENTIAL/PLATELET
Basophils Absolute: 0 10*3/uL (ref 0.0–0.1)
Basophils Relative: 0.7 % (ref 0.0–3.0)
Eosinophils Absolute: 0.2 10*3/uL (ref 0.0–0.7)
Eosinophils Relative: 3.3 % (ref 0.0–5.0)
HCT: 41.6 % (ref 36.0–46.0)
Hemoglobin: 14 g/dL (ref 12.0–15.0)
Lymphocytes Relative: 30.2 % (ref 12.0–46.0)
Lymphs Abs: 1.9 10*3/uL (ref 0.7–4.0)
MCHC: 33.7 g/dL (ref 30.0–36.0)
MCV: 92.6 fl (ref 78.0–100.0)
Monocytes Absolute: 0.6 10*3/uL (ref 0.1–1.0)
Monocytes Relative: 9.1 % (ref 3.0–12.0)
Neutro Abs: 3.6 10*3/uL (ref 1.4–7.7)
Neutrophils Relative %: 56.7 % (ref 43.0–77.0)
Platelets: 211 10*3/uL (ref 150.0–400.0)
RBC: 4.49 Mil/uL (ref 3.87–5.11)
RDW: 14.5 % (ref 11.5–15.5)
WBC: 6.4 10*3/uL (ref 4.0–10.5)

## 2020-03-08 LAB — TSH: TSH: 4.86 u[IU]/mL — ABNORMAL HIGH (ref 0.35–4.50)

## 2020-03-08 NOTE — Patient Instructions (Addendum)
Please return in 12 months for your annual complete physical; please come fasting.  I will release your lab results to you on your MyChart account with further instructions. Please reply with any questions.  Take care! We will ensure we don't need to adjust your thyroid medication dose due to your weight loss.   Today you were given your Tdap vaccination.   If you have any questions or concerns, please don't hesitate to send me a message via MyChart or call the office at [phone]. Thank you for visiting with [place] today! It's our pleasure caring for you.  I have ordered a mammogram and/or bone density for you as we discussed today: [x]   Mammogram  []   Bone Density  Please call the office checked below to schedule your appointment: Your appointment will at the following location  [x]   [facility]      [place], [place]         []   [facility]  [address] [place], [place]
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
## 2020-03-08 NOTE — Addendum Note (Signed)
Addended by: Doran Clay A on: 03/08/2020 09:09 AM   Modules accepted: Orders

## 2020-03-09 ENCOUNTER — Other Ambulatory Visit: Payer: Self-pay

## 2020-03-09 DIAGNOSIS — E039 Hypothyroidism, unspecified: Secondary | ICD-10-CM

## 2020-03-09 LAB — HEPATITIS C ANTIBODY
Hepatitis C Ab: NONREACTIVE
SIGNAL TO CUT-OFF: 0.01 (ref <1.00)

## 2020-03-23 ENCOUNTER — Other Ambulatory Visit: Payer: Self-pay

## 2020-03-23 ENCOUNTER — Other Ambulatory Visit (INDEPENDENT_AMBULATORY_CARE_PROVIDER_SITE_OTHER): Payer: 59

## 2020-03-23 DIAGNOSIS — E039 Hypothyroidism, unspecified: Secondary | ICD-10-CM

## 2020-03-23 LAB — TSH: TSH: 2.68 u[IU]/mL (ref 0.35–4.50)

## 2020-03-24 ENCOUNTER — Other Ambulatory Visit: Payer: 59

## 2020-03-27 ENCOUNTER — Other Ambulatory Visit: Payer: Self-pay | Admitting: Family Medicine

## 2020-03-27 ENCOUNTER — Other Ambulatory Visit: Payer: Self-pay

## 2020-03-27 ENCOUNTER — Encounter: Payer: Self-pay | Admitting: Family Medicine

## 2020-03-27 MED ORDER — LEVOTHYROXINE SODIUM 200 MCG PO TABS: 200.0000 ug | ORAL_TABLET | Freq: Every day | ORAL | 3 refills | Status: DC

## 2020-06-19 ENCOUNTER — Other Ambulatory Visit (HOSPITAL_COMMUNITY): Payer: Self-pay

## 2020-06-19 MED ORDER — LORAZEPAM 2 MG PO TABS
2.0000 mg | ORAL_TABLET | ORAL | 0 refills | Status: DC
  Filled 2020-06-19: qty 1, 1d supply, fill #0

## 2020-06-28 ENCOUNTER — Other Ambulatory Visit (HOSPITAL_COMMUNITY): Payer: Self-pay

## 2020-06-28 MED ORDER — AMOXICILLIN 500 MG PO CAPS
ORAL_CAPSULE | ORAL | 0 refills | Status: DC
  Filled 2020-06-28: qty 15, 5d supply, fill #0

## 2020-07-01 ENCOUNTER — Other Ambulatory Visit (HOSPITAL_COMMUNITY): Payer: Self-pay

## 2020-07-01 MED FILL — Levothyroxine Sodium Tab 200 MCG: ORAL | 90 days supply | Qty: 90 | Fill #0 | Status: AC

## 2020-08-09 ENCOUNTER — Other Ambulatory Visit: Payer: Self-pay

## 2020-08-09 ENCOUNTER — Ambulatory Visit
Admission: RE | Admit: 2020-08-09 | Discharge: 2020-08-09 | Disposition: A | Discharge status: Home / Self Care | Payer: 59 | Source: Ambulatory Visit | Attending: Family Medicine | Admitting: Family Medicine

## 2020-08-09 DIAGNOSIS — Z1231 Encounter for screening mammogram for malignant neoplasm of breast: Secondary | ICD-10-CM | POA: Diagnosis not present

## 2020-08-16 ENCOUNTER — Ambulatory Visit: Payer: 59

## 2020-09-11 DIAGNOSIS — R5383 Other fatigue: Secondary | ICD-10-CM | POA: Diagnosis not present

## 2020-09-11 DIAGNOSIS — E038 Other specified hypothyroidism: Secondary | ICD-10-CM | POA: Diagnosis not present

## 2020-09-11 DIAGNOSIS — N951 Menopausal and female climacteric states: Secondary | ICD-10-CM | POA: Diagnosis not present

## 2020-09-11 DIAGNOSIS — R635 Abnormal weight gain: Secondary | ICD-10-CM | POA: Diagnosis not present

## 2020-09-11 DIAGNOSIS — E611 Iron deficiency: Secondary | ICD-10-CM | POA: Diagnosis not present

## 2020-09-15 ENCOUNTER — Other Ambulatory Visit (HOSPITAL_COMMUNITY): Payer: Self-pay

## 2020-09-15 DIAGNOSIS — Z1339 Encounter for screening examination for other mental health and behavioral disorders: Secondary | ICD-10-CM | POA: Diagnosis not present

## 2020-09-15 DIAGNOSIS — E038 Other specified hypothyroidism: Secondary | ICD-10-CM | POA: Diagnosis not present

## 2020-09-15 DIAGNOSIS — Z6827 Body mass index (BMI) 27.0-27.9, adult: Secondary | ICD-10-CM | POA: Diagnosis not present

## 2020-09-15 DIAGNOSIS — Z789 Other specified health status: Secondary | ICD-10-CM | POA: Diagnosis not present

## 2020-09-15 DIAGNOSIS — R635 Abnormal weight gain: Secondary | ICD-10-CM | POA: Diagnosis not present

## 2020-09-15 DIAGNOSIS — Z1331 Encounter for screening for depression: Secondary | ICD-10-CM | POA: Diagnosis not present

## 2020-09-15 DIAGNOSIS — R5383 Other fatigue: Secondary | ICD-10-CM | POA: Diagnosis not present

## 2020-09-15 DIAGNOSIS — N951 Menopausal and female climacteric states: Secondary | ICD-10-CM | POA: Diagnosis not present

## 2020-09-15 DIAGNOSIS — E611 Iron deficiency: Secondary | ICD-10-CM | POA: Diagnosis not present

## 2020-09-15 MED ORDER — LIOTHYRONINE SODIUM 5 MCG PO TABS
5.0000 ug | ORAL_TABLET | Freq: Every morning | ORAL | 1 refills | Status: DC
  Filled 2020-09-15: qty 60, 30d supply, fill #0

## 2020-10-02 DIAGNOSIS — M25562 Pain in left knee: Secondary | ICD-10-CM | POA: Diagnosis not present

## 2020-10-19 DIAGNOSIS — E038 Other specified hypothyroidism: Secondary | ICD-10-CM | POA: Diagnosis not present

## 2020-10-19 DIAGNOSIS — N951 Menopausal and female climacteric states: Secondary | ICD-10-CM | POA: Diagnosis not present

## 2020-10-22 MED FILL — Levothyroxine Sodium Tab 200 MCG: ORAL | 90 days supply | Qty: 90 | Fill #1 | Status: AC

## 2020-10-23 ENCOUNTER — Other Ambulatory Visit (HOSPITAL_COMMUNITY): Payer: Self-pay

## 2020-10-26 ENCOUNTER — Other Ambulatory Visit (HOSPITAL_COMMUNITY): Payer: Self-pay

## 2020-10-26 DIAGNOSIS — R6882 Decreased libido: Secondary | ICD-10-CM | POA: Diagnosis not present

## 2020-10-26 DIAGNOSIS — M2550 Pain in unspecified joint: Secondary | ICD-10-CM | POA: Diagnosis not present

## 2020-10-26 DIAGNOSIS — R5383 Other fatigue: Secondary | ICD-10-CM | POA: Diagnosis not present

## 2020-10-26 DIAGNOSIS — N951 Menopausal and female climacteric states: Secondary | ICD-10-CM | POA: Diagnosis not present

## 2020-10-26 DIAGNOSIS — Z6826 Body mass index (BMI) 26.0-26.9, adult: Secondary | ICD-10-CM | POA: Diagnosis not present

## 2020-10-26 DIAGNOSIS — E038 Other specified hypothyroidism: Secondary | ICD-10-CM | POA: Diagnosis not present

## 2020-10-26 MED ORDER — LIOTHYRONINE SODIUM 5 MCG PO TABS
ORAL_TABLET | ORAL | 2 refills | Status: DC
  Filled 2020-10-26: qty 60, 30d supply, fill #0
  Filled 2020-11-25: qty 60, 30d supply, fill #1
  Filled 2021-01-06: qty 60, 30d supply, fill #2

## 2020-11-14 DIAGNOSIS — M9905 Segmental and somatic dysfunction of pelvic region: Secondary | ICD-10-CM | POA: Diagnosis not present

## 2020-11-14 DIAGNOSIS — M5137 Other intervertebral disc degeneration, lumbosacral region: Secondary | ICD-10-CM | POA: Diagnosis not present

## 2020-11-14 DIAGNOSIS — M9904 Segmental and somatic dysfunction of sacral region: Secondary | ICD-10-CM | POA: Diagnosis not present

## 2020-11-14 DIAGNOSIS — M5441 Lumbago with sciatica, right side: Secondary | ICD-10-CM | POA: Diagnosis not present

## 2020-11-14 DIAGNOSIS — M9902 Segmental and somatic dysfunction of thoracic region: Secondary | ICD-10-CM | POA: Diagnosis not present

## 2020-11-14 DIAGNOSIS — M9903 Segmental and somatic dysfunction of lumbar region: Secondary | ICD-10-CM | POA: Diagnosis not present

## 2020-11-20 DIAGNOSIS — M9904 Segmental and somatic dysfunction of sacral region: Secondary | ICD-10-CM | POA: Diagnosis not present

## 2020-11-20 DIAGNOSIS — M5137 Other intervertebral disc degeneration, lumbosacral region: Secondary | ICD-10-CM | POA: Diagnosis not present

## 2020-11-20 DIAGNOSIS — M9903 Segmental and somatic dysfunction of lumbar region: Secondary | ICD-10-CM | POA: Diagnosis not present

## 2020-11-20 DIAGNOSIS — M9905 Segmental and somatic dysfunction of pelvic region: Secondary | ICD-10-CM | POA: Diagnosis not present

## 2020-11-20 DIAGNOSIS — M9902 Segmental and somatic dysfunction of thoracic region: Secondary | ICD-10-CM | POA: Diagnosis not present

## 2020-11-20 DIAGNOSIS — M5441 Lumbago with sciatica, right side: Secondary | ICD-10-CM | POA: Diagnosis not present

## 2020-11-25 ENCOUNTER — Other Ambulatory Visit (HOSPITAL_COMMUNITY): Payer: Self-pay

## 2020-11-27 DIAGNOSIS — M9903 Segmental and somatic dysfunction of lumbar region: Secondary | ICD-10-CM | POA: Diagnosis not present

## 2020-11-27 DIAGNOSIS — M9905 Segmental and somatic dysfunction of pelvic region: Secondary | ICD-10-CM | POA: Diagnosis not present

## 2020-11-27 DIAGNOSIS — M9902 Segmental and somatic dysfunction of thoracic region: Secondary | ICD-10-CM | POA: Diagnosis not present

## 2020-11-27 DIAGNOSIS — M5137 Other intervertebral disc degeneration, lumbosacral region: Secondary | ICD-10-CM | POA: Diagnosis not present

## 2020-11-27 DIAGNOSIS — M9904 Segmental and somatic dysfunction of sacral region: Secondary | ICD-10-CM | POA: Diagnosis not present

## 2020-11-27 DIAGNOSIS — M5441 Lumbago with sciatica, right side: Secondary | ICD-10-CM | POA: Diagnosis not present

## 2020-12-11 DIAGNOSIS — M9902 Segmental and somatic dysfunction of thoracic region: Secondary | ICD-10-CM | POA: Diagnosis not present

## 2020-12-11 DIAGNOSIS — M9903 Segmental and somatic dysfunction of lumbar region: Secondary | ICD-10-CM | POA: Diagnosis not present

## 2020-12-11 DIAGNOSIS — M5137 Other intervertebral disc degeneration, lumbosacral region: Secondary | ICD-10-CM | POA: Diagnosis not present

## 2020-12-11 DIAGNOSIS — M9905 Segmental and somatic dysfunction of pelvic region: Secondary | ICD-10-CM | POA: Diagnosis not present

## 2020-12-11 DIAGNOSIS — M5441 Lumbago with sciatica, right side: Secondary | ICD-10-CM | POA: Diagnosis not present

## 2020-12-11 DIAGNOSIS — M9904 Segmental and somatic dysfunction of sacral region: Secondary | ICD-10-CM | POA: Diagnosis not present

## 2020-12-26 ENCOUNTER — Ambulatory Visit: Payer: 59

## 2020-12-26 DIAGNOSIS — M9904 Segmental and somatic dysfunction of sacral region: Secondary | ICD-10-CM | POA: Diagnosis not present

## 2020-12-26 DIAGNOSIS — M5441 Lumbago with sciatica, right side: Secondary | ICD-10-CM | POA: Diagnosis not present

## 2020-12-26 DIAGNOSIS — M5137 Other intervertebral disc degeneration, lumbosacral region: Secondary | ICD-10-CM | POA: Diagnosis not present

## 2020-12-26 DIAGNOSIS — M9903 Segmental and somatic dysfunction of lumbar region: Secondary | ICD-10-CM | POA: Diagnosis not present

## 2020-12-26 DIAGNOSIS — M9905 Segmental and somatic dysfunction of pelvic region: Secondary | ICD-10-CM | POA: Diagnosis not present

## 2020-12-26 DIAGNOSIS — M9902 Segmental and somatic dysfunction of thoracic region: Secondary | ICD-10-CM | POA: Diagnosis not present

## 2020-12-27 DIAGNOSIS — N951 Menopausal and female climacteric states: Secondary | ICD-10-CM | POA: Diagnosis not present

## 2020-12-27 DIAGNOSIS — R5383 Other fatigue: Secondary | ICD-10-CM | POA: Diagnosis not present

## 2020-12-27 DIAGNOSIS — E038 Other specified hypothyroidism: Secondary | ICD-10-CM | POA: Diagnosis not present

## 2020-12-27 DIAGNOSIS — R6882 Decreased libido: Secondary | ICD-10-CM | POA: Diagnosis not present

## 2020-12-27 DIAGNOSIS — Z6826 Body mass index (BMI) 26.0-26.9, adult: Secondary | ICD-10-CM | POA: Diagnosis not present

## 2020-12-28 ENCOUNTER — Ambulatory Visit: Payer: Self-pay

## 2020-12-28 ENCOUNTER — Ambulatory Visit: Payer: Self-pay | Attending: Internal Medicine

## 2020-12-28 ENCOUNTER — Other Ambulatory Visit (HOSPITAL_BASED_OUTPATIENT_CLINIC_OR_DEPARTMENT_OTHER): Payer: Self-pay

## 2020-12-28 DIAGNOSIS — Z23 Encounter for immunization: Secondary | ICD-10-CM

## 2020-12-28 MED ORDER — PFIZER COVID-19 VAC BIVALENT 30 MCG/0.3ML IM SUSP
INTRAMUSCULAR | 0 refills | Status: DC
  Filled 2020-12-28: qty 0.3, 1d supply, fill #0

## 2020-12-28 NOTE — Progress Notes (Signed)
° °  Covid-19 Vaccination Clinic  Name:  Sophia Blackwell    MRN: [id number] DOB: [birth year]  12/28/2020  Ms. Sophia Blackwell was observed post Covid-19 immunization for 15 minutes without incident. She was provided with Vaccine Information Sheet and instruction to access the V-Safe system.   Ms. Sophia Blackwell was instructed to call 911 with any severe reactions post vaccine: Difficulty breathing  Swelling of face and throat  A fast heartbeat  A bad rash all over body  Dizziness and weakness   Immunizations Administered     Name Date Dose VIS Date Route   Pfizer Covid-19 Vaccine Bivalent Booster 12/28/2020  3:13 PM 0.3 mL 09/13/2020 Intramuscular   Manufacturer: [place]   Lot: [id number]   [place]: [phone]

## 2021-01-06 MED FILL — Levothyroxine Sodium Tab 200 MCG: ORAL | 90 days supply | Qty: 90 | Fill #2 | Status: AC

## 2021-01-07 ENCOUNTER — Other Ambulatory Visit (HOSPITAL_COMMUNITY): Payer: Self-pay

## 2021-01-09 ENCOUNTER — Other Ambulatory Visit (HOSPITAL_COMMUNITY): Payer: Self-pay

## 2021-01-16 DIAGNOSIS — M5137 Other intervertebral disc degeneration, lumbosacral region: Secondary | ICD-10-CM | POA: Diagnosis not present

## 2021-01-16 DIAGNOSIS — M9904 Segmental and somatic dysfunction of sacral region: Secondary | ICD-10-CM | POA: Diagnosis not present

## 2021-01-16 DIAGNOSIS — M5441 Lumbago with sciatica, right side: Secondary | ICD-10-CM | POA: Diagnosis not present

## 2021-01-16 DIAGNOSIS — M9903 Segmental and somatic dysfunction of lumbar region: Secondary | ICD-10-CM | POA: Diagnosis not present

## 2021-01-16 DIAGNOSIS — M9905 Segmental and somatic dysfunction of pelvic region: Secondary | ICD-10-CM | POA: Diagnosis not present

## 2021-01-16 DIAGNOSIS — M9902 Segmental and somatic dysfunction of thoracic region: Secondary | ICD-10-CM | POA: Diagnosis not present

## 2021-01-27 ENCOUNTER — Other Ambulatory Visit (HOSPITAL_COMMUNITY): Payer: Self-pay

## 2021-01-31 ENCOUNTER — Other Ambulatory Visit (HOSPITAL_COMMUNITY): Payer: Self-pay

## 2021-01-31 MED ORDER — LIOTHYRONINE SODIUM 5 MCG PO TABS
ORAL_TABLET | ORAL | 0 refills | Status: DC
  Filled 2021-01-31: qty 180, 90d supply, fill #0

## 2021-02-13 DIAGNOSIS — M9904 Segmental and somatic dysfunction of sacral region: Secondary | ICD-10-CM | POA: Diagnosis not present

## 2021-02-13 DIAGNOSIS — M9905 Segmental and somatic dysfunction of pelvic region: Secondary | ICD-10-CM | POA: Diagnosis not present

## 2021-02-13 DIAGNOSIS — M9903 Segmental and somatic dysfunction of lumbar region: Secondary | ICD-10-CM | POA: Diagnosis not present

## 2021-02-13 DIAGNOSIS — M5441 Lumbago with sciatica, right side: Secondary | ICD-10-CM | POA: Diagnosis not present

## 2021-02-13 DIAGNOSIS — M5137 Other intervertebral disc degeneration, lumbosacral region: Secondary | ICD-10-CM | POA: Diagnosis not present

## 2021-02-13 DIAGNOSIS — M9902 Segmental and somatic dysfunction of thoracic region: Secondary | ICD-10-CM | POA: Diagnosis not present

## 2021-03-13 ENCOUNTER — Other Ambulatory Visit: Payer: Self-pay

## 2021-03-13 ENCOUNTER — Encounter: Payer: Self-pay | Admitting: Family Medicine

## 2021-03-13 ENCOUNTER — Encounter: Payer: 59 | Admitting: Family Medicine

## 2021-03-13 ENCOUNTER — Ambulatory Visit (INDEPENDENT_AMBULATORY_CARE_PROVIDER_SITE_OTHER): Payer: 59 | Admitting: Family Medicine

## 2021-03-13 VITALS — BP 120/82 | HR 74 | Temp 98.4°F | Ht 67.0 in | Wt 162.0 lb

## 2021-03-13 DIAGNOSIS — K5000 Crohn's disease of small intestine without complications: Secondary | ICD-10-CM

## 2021-03-13 DIAGNOSIS — Z0000 Encounter for general adult medical examination without abnormal findings: Secondary | ICD-10-CM | POA: Diagnosis not present

## 2021-03-13 DIAGNOSIS — E890 Postprocedural hypothyroidism: Secondary | ICD-10-CM

## 2021-03-13 DIAGNOSIS — J309 Allergic rhinitis, unspecified: Secondary | ICD-10-CM

## 2021-03-13 LAB — LIPID PANEL
Cholesterol: 179 mg/dL (ref 0–200)
HDL: 65.2 mg/dL (ref >39.00)
LDL Cholesterol: 105 mg/dL — ABNORMAL HIGH (ref 0–99)
NonHDL: 113.3
Total CHOL/HDL Ratio: 3
Triglycerides: 42 mg/dL (ref 0.0–149.0)
VLDL: 8.4 mg/dL (ref 0.0–40.0)

## 2021-03-13 LAB — COMPREHENSIVE METABOLIC PANEL
ALT: 19 U/L (ref 0–35)
AST: 18 U/L (ref 0–37)
Albumin: 4.3 g/dL (ref 3.5–5.2)
Alkaline Phosphatase: 51 U/L (ref 39–117)
BUN: 19 mg/dL (ref 6–23)
CO2: 25 mEq/L (ref 19–32)
Calcium: 9.9 mg/dL (ref 8.4–10.5)
Chloride: 103 mEq/L (ref 96–112)
Creatinine, Ser: 0.69 mg/dL (ref 0.40–1.20)
GFR: 106.43 mL/min (ref >60.00)
Glucose, Bld: 93 mg/dL (ref 70–99)
Potassium: 4.2 mEq/L (ref 3.5–5.1)
Sodium: 136 mEq/L (ref 135–145)
Total Bilirubin: 0.7 mg/dL (ref 0.2–1.2)
Total Protein: 7.1 g/dL (ref 6.0–8.3)

## 2021-03-13 LAB — CBC WITH DIFFERENTIAL/PLATELET
Basophils Absolute: 0 10*3/uL (ref 0.0–0.1)
Basophils Relative: 0.8 % (ref 0.0–3.0)
Eosinophils Absolute: 0.1 10*3/uL (ref 0.0–0.7)
Eosinophils Relative: 1.7 % (ref 0.0–5.0)
HCT: 41.3 % (ref 36.0–46.0)
Hemoglobin: 13.6 g/dL (ref 12.0–15.0)
Lymphocytes Relative: 30.1 % (ref 12.0–46.0)
Lymphs Abs: 1.5 10*3/uL (ref 0.7–4.0)
MCHC: 33.1 g/dL (ref 30.0–36.0)
MCV: 92.3 fl (ref 78.0–100.0)
Monocytes Absolute: 0.4 10*3/uL (ref 0.1–1.0)
Monocytes Relative: 7.1 % (ref 3.0–12.0)
Neutro Abs: 3.1 10*3/uL (ref 1.4–7.7)
Neutrophils Relative %: 60.3 % (ref 43.0–77.0)
Platelets: 218 10*3/uL (ref 150.0–400.0)
RBC: 4.47 Mil/uL (ref 3.87–5.11)
RDW: 13.3 % (ref 11.5–15.5)
WBC: 5.1 10*3/uL (ref 4.0–10.5)

## 2021-03-13 LAB — TSH: TSH: 0.02 u[IU]/mL — ABNORMAL LOW (ref 0.35–5.50)

## 2021-03-13 NOTE — Patient Instructions (Signed)
Please return in 12 months for your annual complete physical; please come fasting.   I will release your lab results to you on your MyChart account with further instructions. You may see the results before I do, but when I review them I will send you a message with my report or have my assistant call you if things need to be discussed. Please reply to my message with any questions. Thank you!   I'm thrilled for you! You are awesome!  If you have any questions or concerns, please don't hesitate to send me a message via MyChart or call the office at [phone]. Thank you for visiting with [place] today! It's our pleasure caring for you.   Please do these things to maintain good health!  Exercise at least 30-45 minutes a day,  4-5 days a week.  Eat a low-fat diet with lots of fruits and vegetables, up to 7-9 servings per day. Drink plenty of water daily. Try to drink 8 8oz glasses per day. Seatbelts can save your life. Always wear your seatbelt. Place Smoke Detectors on every level of your home and check batteries every year. Schedule an appointment with an eye doctor for an eye exam every 1-2 years Safe sex - use condoms to protect yourself from STDs if you could be exposed to these types of infections. Use birth control if you do not want to become pregnant and are sexually active. Avoid heavy alcohol use. If you drink, keep it to less than 2 drinks/day and not every day. [place].  Choose someone you trust that could speak for you if you became unable to speak for yourself. Depression is common in our stressful world.If you're feeling down or losing interest in things you normally enjoy, please come in for a visit. If anyone is threatening or hurting you, please get help. Physical or Emotional Violence is never OK.
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
## 2021-03-18 ENCOUNTER — Other Ambulatory Visit (HOSPITAL_COMMUNITY): Payer: Self-pay

## 2021-03-18 MED ORDER — LEVOTHYROXINE SODIUM 175 MCG PO TABS
175.0000 ug | ORAL_TABLET | Freq: Every day | ORAL | 3 refills | Status: DC
  Filled 2021-03-18: qty 90, 90d supply, fill #0
  Filled 2021-06-04: qty 90, 90d supply, fill #1
  Filled 2021-09-10: qty 90, 90d supply, fill #2

## 2021-03-18 NOTE — Addendum Note (Signed)
Addended by: Billey Chang on: 03/18/2021 01:07 PM   Modules accepted: Orders

## 2021-03-19 ENCOUNTER — Other Ambulatory Visit (HOSPITAL_COMMUNITY): Payer: Self-pay

## 2021-03-20 DIAGNOSIS — M5137 Other intervertebral disc degeneration, lumbosacral region: Secondary | ICD-10-CM | POA: Diagnosis not present

## 2021-03-20 DIAGNOSIS — M9904 Segmental and somatic dysfunction of sacral region: Secondary | ICD-10-CM | POA: Diagnosis not present

## 2021-03-20 DIAGNOSIS — M5441 Lumbago with sciatica, right side: Secondary | ICD-10-CM | POA: Diagnosis not present

## 2021-03-20 DIAGNOSIS — M9903 Segmental and somatic dysfunction of lumbar region: Secondary | ICD-10-CM | POA: Diagnosis not present

## 2021-03-20 DIAGNOSIS — M9905 Segmental and somatic dysfunction of pelvic region: Secondary | ICD-10-CM | POA: Diagnosis not present

## 2021-03-20 DIAGNOSIS — M9902 Segmental and somatic dysfunction of thoracic region: Secondary | ICD-10-CM | POA: Diagnosis not present

## 2021-03-21 ENCOUNTER — Other Ambulatory Visit: Payer: Self-pay

## 2021-03-21 DIAGNOSIS — E890 Postprocedural hypothyroidism: Secondary | ICD-10-CM

## 2021-04-04 DIAGNOSIS — N951 Menopausal and female climacteric states: Secondary | ICD-10-CM | POA: Diagnosis not present

## 2021-04-04 DIAGNOSIS — E038 Other specified hypothyroidism: Secondary | ICD-10-CM | POA: Diagnosis not present

## 2021-04-06 ENCOUNTER — Other Ambulatory Visit (HOSPITAL_COMMUNITY): Payer: Self-pay

## 2021-04-06 DIAGNOSIS — E038 Other specified hypothyroidism: Secondary | ICD-10-CM | POA: Diagnosis not present

## 2021-04-06 DIAGNOSIS — N951 Menopausal and female climacteric states: Secondary | ICD-10-CM | POA: Diagnosis not present

## 2021-04-06 DIAGNOSIS — G479 Sleep disorder, unspecified: Secondary | ICD-10-CM | POA: Diagnosis not present

## 2021-04-06 DIAGNOSIS — F32A Depression, unspecified: Secondary | ICD-10-CM | POA: Diagnosis not present

## 2021-04-06 DIAGNOSIS — Z6827 Body mass index (BMI) 27.0-27.9, adult: Secondary | ICD-10-CM | POA: Diagnosis not present

## 2021-04-06 MED ORDER — LIOTHYRONINE SODIUM 5 MCG PO TABS
ORAL_TABLET | ORAL | 1 refills | Status: DC
  Filled 2021-04-28: qty 270, 90d supply, fill #0

## 2021-04-28 ENCOUNTER — Other Ambulatory Visit (HOSPITAL_COMMUNITY): Payer: Self-pay

## 2021-06-04 ENCOUNTER — Encounter: Payer: Self-pay | Admitting: Family Medicine

## 2021-06-04 ENCOUNTER — Other Ambulatory Visit (HOSPITAL_COMMUNITY): Payer: Self-pay

## 2021-06-20 IMAGING — DX DG HIP (WITH OR WITHOUT PELVIS) 2-3V RIGHT
5 series · 5 of 5 positions shown · non-contrast
Comparison: None.

CLINICAL DATA: Pain

EXAM:
DG HIP (WITH OR WITHOUT PELVIS) 2-3V RIGHT

[sinuses ([person_name]) pa]
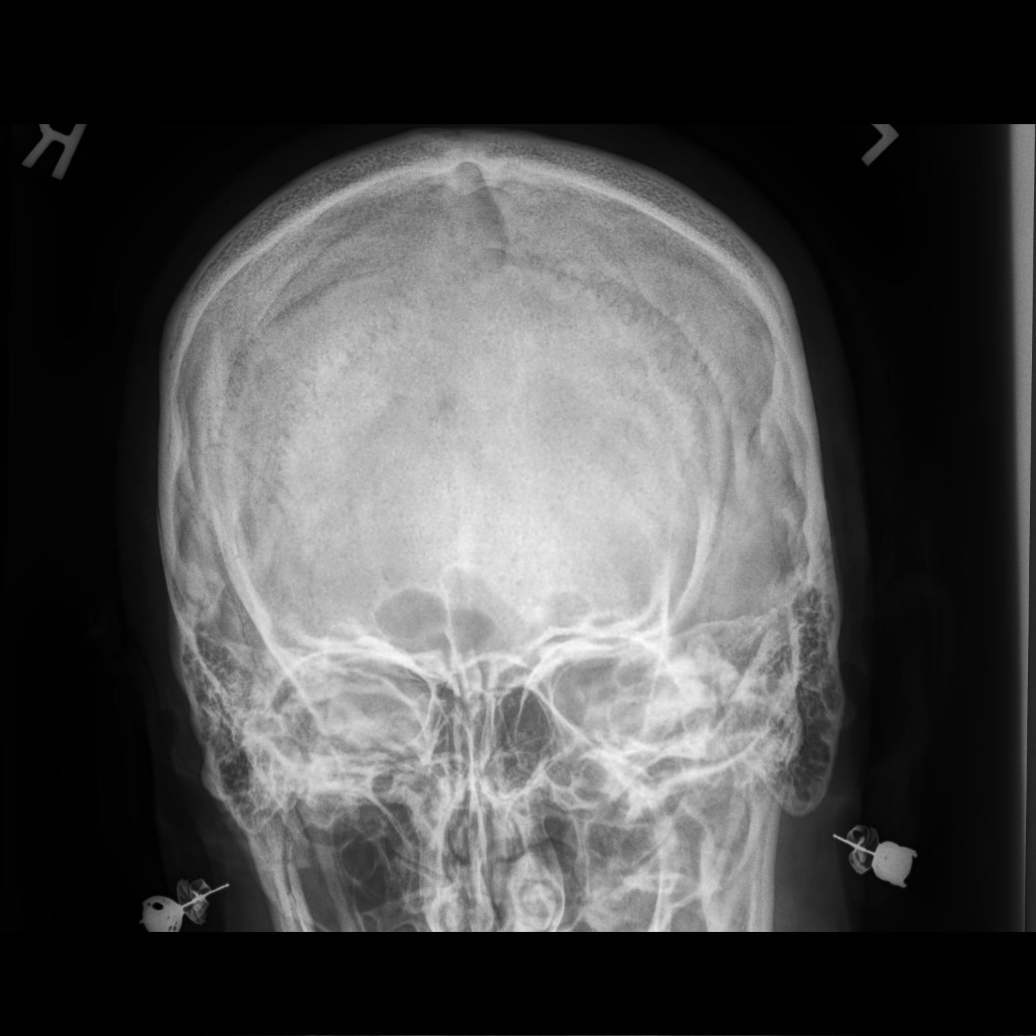

[pelvis ap (1 of 2)]
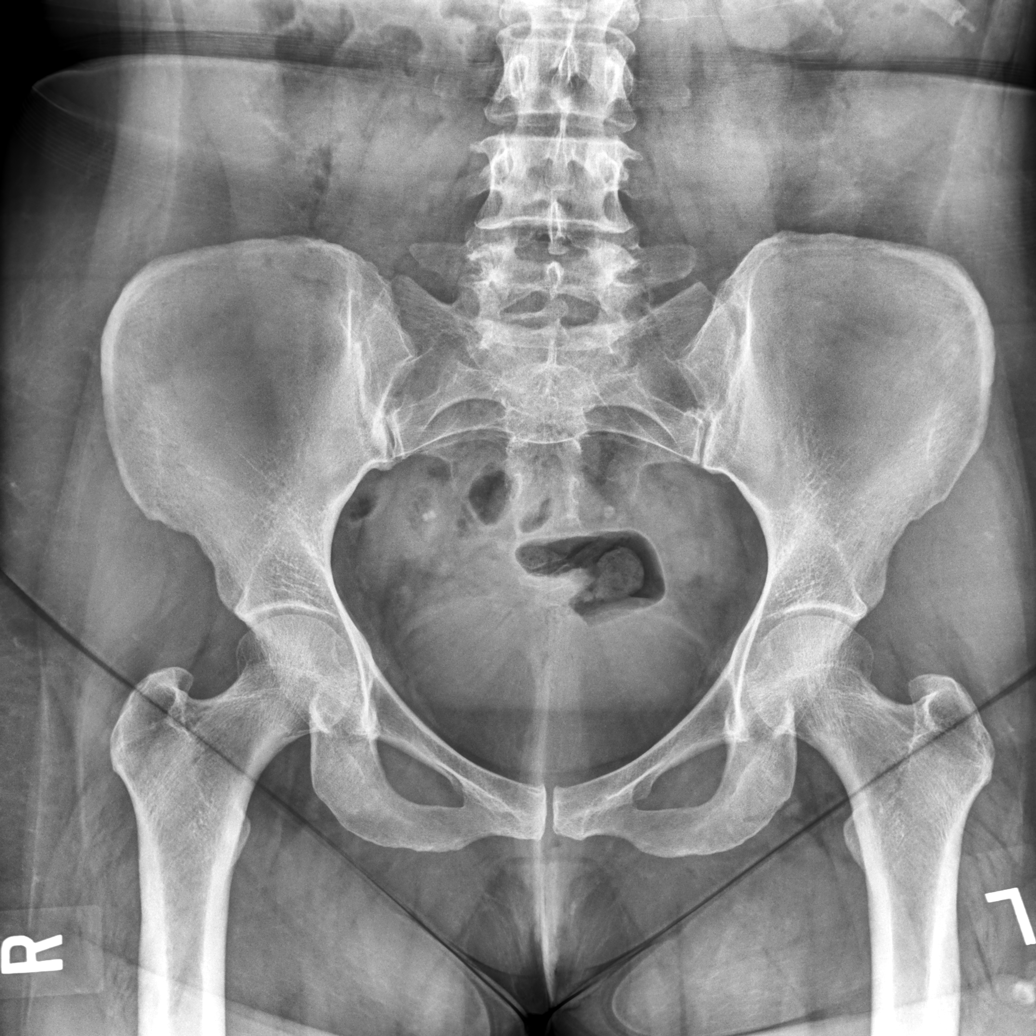

[hip (frog leg) lat]
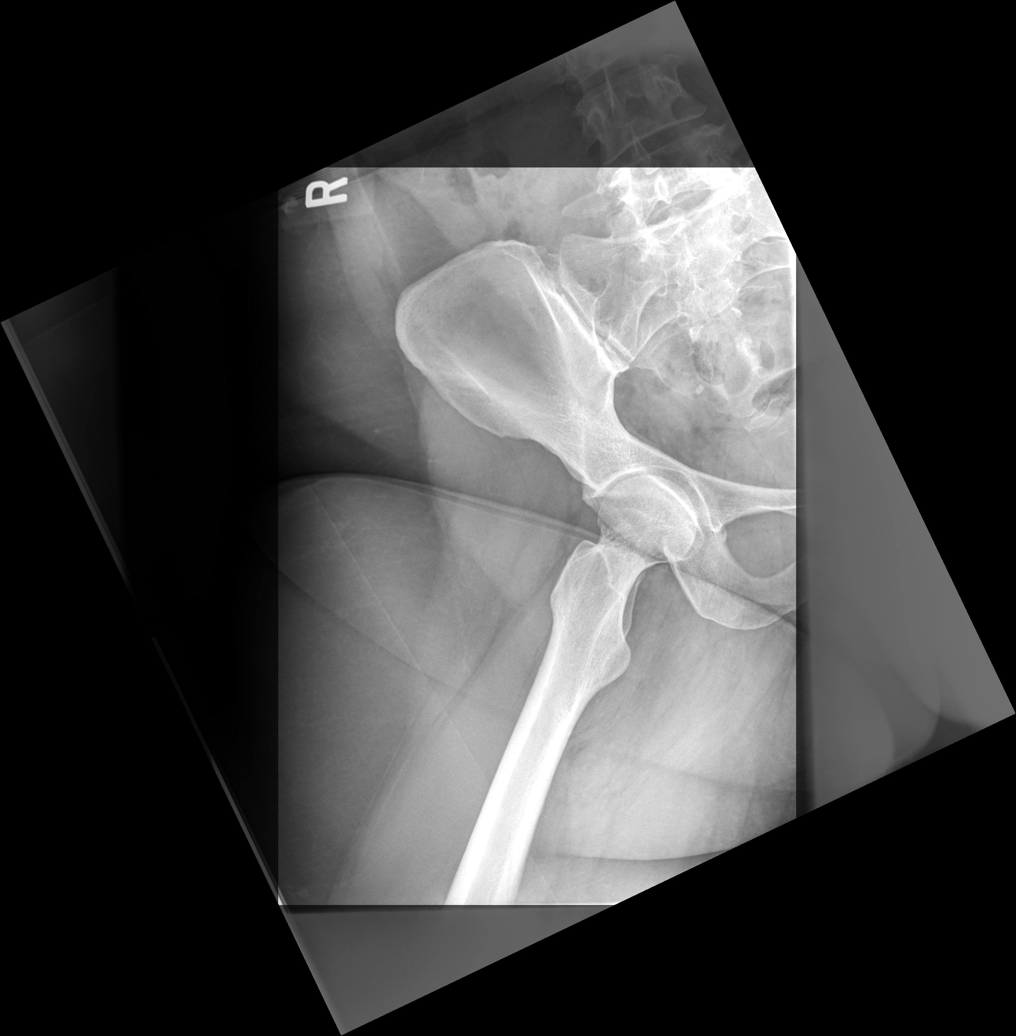

[sinuses lat]
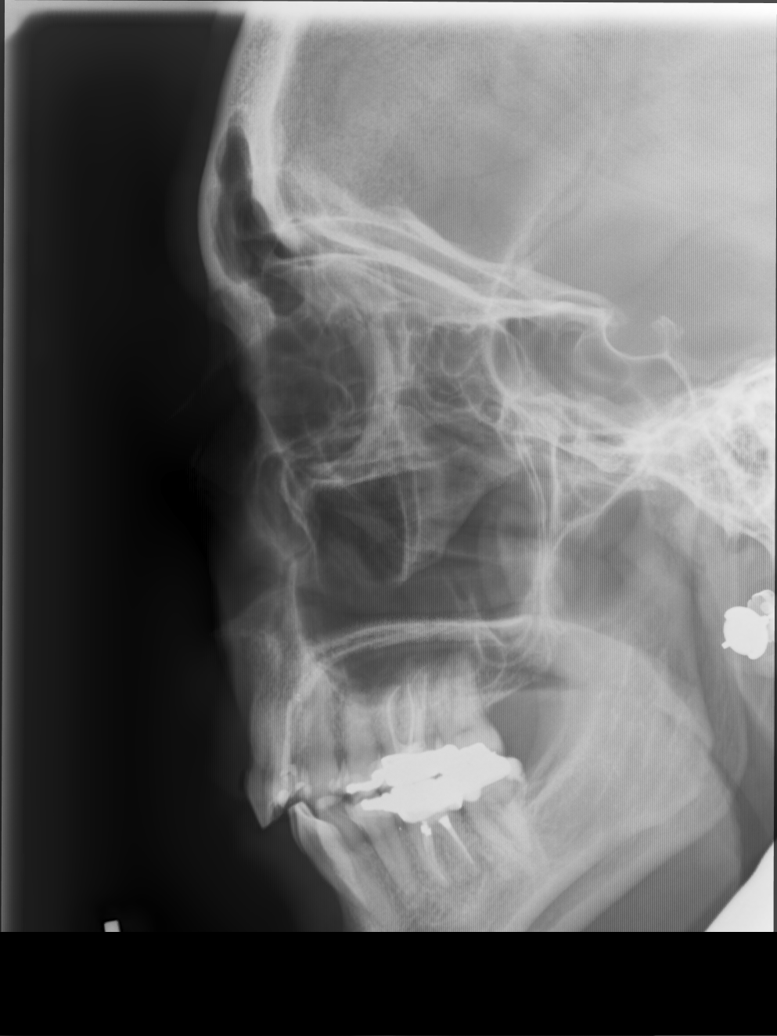

[pelvis ap (2 of 2)]
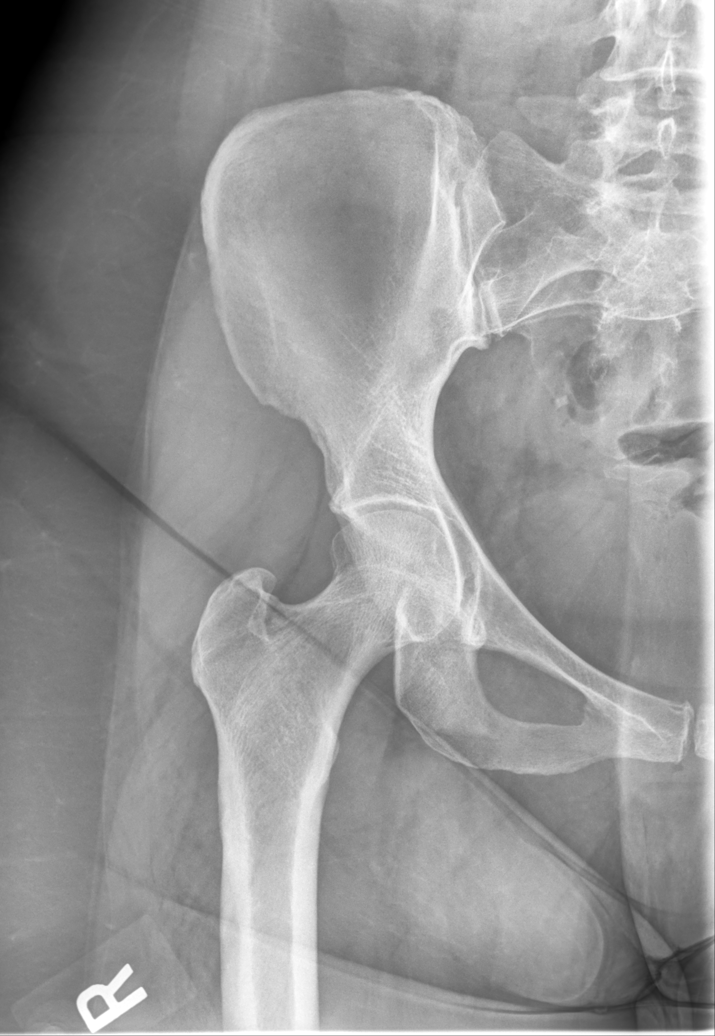

[5 of 5 positions shown; findings below may reference images not displayed]

FINDINGS: Frontal pelvis as well as frontal and lateral right hip images were
obtained. No fracture or dislocation. Joint spaces appear normal. No
erosive change.
IMPRESSION: No fracture or dislocation.  No evident arthropathy.

## 2021-07-04 DIAGNOSIS — E038 Other specified hypothyroidism: Secondary | ICD-10-CM | POA: Diagnosis not present

## 2021-07-04 DIAGNOSIS — N951 Menopausal and female climacteric states: Secondary | ICD-10-CM | POA: Diagnosis not present

## 2021-07-06 ENCOUNTER — Other Ambulatory Visit (HOSPITAL_COMMUNITY): Payer: Self-pay

## 2021-07-06 ENCOUNTER — Other Ambulatory Visit (INDEPENDENT_AMBULATORY_CARE_PROVIDER_SITE_OTHER): Payer: Commercial Managed Care - PPO

## 2021-07-06 DIAGNOSIS — Z6829 Body mass index (BMI) 29.0-29.9, adult: Secondary | ICD-10-CM | POA: Diagnosis not present

## 2021-07-06 DIAGNOSIS — R6882 Decreased libido: Secondary | ICD-10-CM | POA: Diagnosis not present

## 2021-07-06 DIAGNOSIS — E038 Other specified hypothyroidism: Secondary | ICD-10-CM | POA: Diagnosis not present

## 2021-07-06 DIAGNOSIS — E890 Postprocedural hypothyroidism: Secondary | ICD-10-CM | POA: Diagnosis not present

## 2021-07-06 DIAGNOSIS — N951 Menopausal and female climacteric states: Secondary | ICD-10-CM | POA: Diagnosis not present

## 2021-07-06 DIAGNOSIS — M2550 Pain in unspecified joint: Secondary | ICD-10-CM | POA: Diagnosis not present

## 2021-07-06 LAB — TSH: TSH: 0.91 u[IU]/mL (ref 0.35–5.50)

## 2021-07-06 MED ORDER — LIOTHYRONINE SODIUM 25 MCG PO TABS
ORAL_TABLET | ORAL | 1 refills | Status: DC
  Filled 2021-08-15 – 2021-09-30 (×2): qty 90, 90d supply, fill #0
  Filled 2022-01-04 – 2022-01-25 (×2): qty 90, 90d supply, fill #1

## 2021-07-06 MED ORDER — LIOTHYRONINE SODIUM 25 MCG PO TABS
ORAL_TABLET | ORAL | 1 refills | Status: DC
  Filled 2021-07-06: qty 90, 90d supply, fill #0

## 2021-07-15 ENCOUNTER — Encounter: Payer: Self-pay | Admitting: Family Medicine

## 2021-07-18 ENCOUNTER — Encounter: Payer: Self-pay | Admitting: Family Medicine

## 2021-07-18 ENCOUNTER — Other Ambulatory Visit (HOSPITAL_COMMUNITY): Payer: Self-pay

## 2021-07-18 ENCOUNTER — Ambulatory Visit (INDEPENDENT_AMBULATORY_CARE_PROVIDER_SITE_OTHER): Payer: Commercial Managed Care - PPO | Admitting: Family Medicine

## 2021-07-18 VITALS — BP 118/72 | HR 62 | Temp 98.2°F | Ht 67.0 in | Wt 184.4 lb

## 2021-07-18 DIAGNOSIS — K9041 Non-celiac gluten sensitivity: Secondary | ICD-10-CM

## 2021-07-18 DIAGNOSIS — K50119 Crohn's disease of large intestine with unspecified complications: Secondary | ICD-10-CM | POA: Diagnosis not present

## 2021-07-18 MED ORDER — BUDESONIDE 3 MG PO CPEP
9.0000 mg | ORAL_CAPSULE | Freq: Every day | ORAL | 3 refills | Status: DC
  Filled 2021-07-18: qty 90, 30d supply, fill #0
  Filled 2021-08-15: qty 90, 30d supply, fill #1
  Filled 2021-09-10: qty 90, 30d supply, fill #2
  Filled 2021-10-26: qty 90, 30d supply, fill #3
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
## 2021-08-15 ENCOUNTER — Other Ambulatory Visit (HOSPITAL_COMMUNITY): Payer: Self-pay

## 2021-08-16 ENCOUNTER — Other Ambulatory Visit (HOSPITAL_COMMUNITY): Payer: Self-pay

## 2021-09-10 ENCOUNTER — Other Ambulatory Visit (HOSPITAL_COMMUNITY): Payer: Self-pay

## 2021-09-15 ENCOUNTER — Other Ambulatory Visit (HOSPITAL_COMMUNITY): Payer: Self-pay

## 2021-09-15 ENCOUNTER — Encounter: Payer: Self-pay | Admitting: Family Medicine

## 2021-09-18 ENCOUNTER — Other Ambulatory Visit (HOSPITAL_COMMUNITY): Payer: Self-pay

## 2021-09-18 MED ORDER — LEVOTHYROXINE SODIUM 175 MCG PO TABS
175.0000 ug | ORAL_TABLET | Freq: Every day | ORAL | 3 refills | Status: DC
  Filled 2021-09-18: qty 90, 90d supply, fill #0
  Filled 2021-12-05: qty 90, 90d supply, fill #1
  Filled 2022-03-13: qty 90, 90d supply, fill #2
  Filled 2022-06-16: qty 90, 90d supply, fill #3

## 2021-09-21 ENCOUNTER — Other Ambulatory Visit (HOSPITAL_COMMUNITY): Payer: Self-pay

## 2021-09-30 ENCOUNTER — Other Ambulatory Visit (HOSPITAL_COMMUNITY): Payer: Self-pay

## 2021-10-01 ENCOUNTER — Other Ambulatory Visit (HOSPITAL_COMMUNITY): Payer: Self-pay

## 2021-10-03 DIAGNOSIS — E038 Other specified hypothyroidism: Secondary | ICD-10-CM | POA: Diagnosis not present

## 2021-10-03 DIAGNOSIS — N951 Menopausal and female climacteric states: Secondary | ICD-10-CM | POA: Diagnosis not present

## 2021-10-04 ENCOUNTER — Ambulatory Visit: Payer: Commercial Managed Care - PPO | Admitting: Gastroenterology

## 2021-10-05 ENCOUNTER — Other Ambulatory Visit (INDEPENDENT_AMBULATORY_CARE_PROVIDER_SITE_OTHER): Payer: 59

## 2021-10-05 ENCOUNTER — Ambulatory Visit (INDEPENDENT_AMBULATORY_CARE_PROVIDER_SITE_OTHER): Payer: 59 | Admitting: Physician Assistant

## 2021-10-05 ENCOUNTER — Encounter: Payer: Self-pay | Admitting: Physician Assistant

## 2021-10-05 ENCOUNTER — Other Ambulatory Visit (HOSPITAL_COMMUNITY): Payer: Self-pay

## 2021-10-05 VITALS — BP 118/78 | HR 80 | Ht 68.0 in | Wt 200.0 lb

## 2021-10-05 DIAGNOSIS — R1084 Generalized abdominal pain: Secondary | ICD-10-CM | POA: Diagnosis not present

## 2021-10-05 DIAGNOSIS — K5000 Crohn's disease of small intestine without complications: Secondary | ICD-10-CM

## 2021-10-05 DIAGNOSIS — G8929 Other chronic pain: Secondary | ICD-10-CM

## 2021-10-05 DIAGNOSIS — G479 Sleep disorder, unspecified: Secondary | ICD-10-CM | POA: Diagnosis not present

## 2021-10-05 DIAGNOSIS — R1013 Epigastric pain: Secondary | ICD-10-CM

## 2021-10-05 DIAGNOSIS — R110 Nausea: Secondary | ICD-10-CM

## 2021-10-05 DIAGNOSIS — R6882 Decreased libido: Secondary | ICD-10-CM | POA: Diagnosis not present

## 2021-10-05 DIAGNOSIS — E038 Other specified hypothyroidism: Secondary | ICD-10-CM | POA: Diagnosis not present

## 2021-10-05 DIAGNOSIS — N951 Menopausal and female climacteric states: Secondary | ICD-10-CM | POA: Diagnosis not present

## 2021-10-05 DIAGNOSIS — Z6831 Body mass index (BMI) 31.0-31.9, adult: Secondary | ICD-10-CM | POA: Diagnosis not present

## 2021-10-05 LAB — CBC WITH DIFFERENTIAL/PLATELET
Basophils Absolute: 0.1 10*3/uL (ref 0.0–0.1)
Basophils Relative: 0.7 % (ref 0.0–3.0)
Eosinophils Absolute: 0.1 10*3/uL (ref 0.0–0.7)
Eosinophils Relative: 0.6 % (ref 0.0–5.0)
HCT: 41.9 % (ref 36.0–46.0)
Hemoglobin: 14.1 g/dL (ref 12.0–15.0)
Lymphocytes Relative: 18.6 % (ref 12.0–46.0)
Lymphs Abs: 1.7 10*3/uL (ref 0.7–4.0)
MCHC: 33.6 g/dL (ref 30.0–36.0)
MCV: 93.4 fl (ref 78.0–100.0)
Monocytes Absolute: 0.6 10*3/uL (ref 0.1–1.0)
Monocytes Relative: 6.2 % (ref 3.0–12.0)
Neutro Abs: 6.9 10*3/uL (ref 1.4–7.7)
Neutrophils Relative %: 73.9 % (ref 43.0–77.0)
Platelets: 222 10*3/uL (ref 150.0–400.0)
RBC: 4.48 Mil/uL (ref 3.87–5.11)
RDW: 14.3 % (ref 11.5–15.5)
WBC: 9.3 10*3/uL (ref 4.0–10.5)

## 2021-10-05 LAB — COMPREHENSIVE METABOLIC PANEL
ALT: 15 U/L (ref 0–35)
AST: 15 U/L (ref 0–37)
Albumin: 4 g/dL (ref 3.5–5.2)
Alkaline Phosphatase: 58 U/L (ref 39–117)
BUN: 18 mg/dL (ref 6–23)
CO2: 26 mEq/L (ref 19–32)
Calcium: 9.5 mg/dL (ref 8.4–10.5)
Chloride: 104 mEq/L (ref 96–112)
Creatinine, Ser: 1.2 mg/dL (ref 0.40–1.20)
GFR: 55.33 mL/min — ABNORMAL LOW (ref >60.00)
Glucose, Bld: 89 mg/dL (ref 70–99)
Potassium: 4 mEq/L (ref 3.5–5.1)
Sodium: 138 mEq/L (ref 135–145)
Total Bilirubin: 0.6 mg/dL (ref 0.2–1.2)
Total Protein: 7 g/dL (ref 6.0–8.3)

## 2021-10-05 LAB — SEDIMENTATION RATE: Sed Rate: 7 mm/hr (ref 0–20)

## 2021-10-05 LAB — VITAMIN B12: Vitamin B-12: >1500 pg/mL — ABNORMAL HIGH (ref 211–911)

## 2021-10-05 LAB — FOLATE: Folate: 15.4 ng/mL (ref >5.9)

## 2021-10-05 LAB — HIGH SENSITIVITY CRP: CRP, High Sensitivity: 1.68 mg/L (ref 0.000–5.000)

## 2021-10-05 MED ORDER — NA SULFATE-K SULFATE-MG SULF 17.5-3.13-1.6 GM/177ML PO SOLN
1.0000 | Freq: Once | ORAL | 0 refills | Status: AC
  Filled 2021-10-05: qty 354, 1d supply, fill #0

## 2021-10-05 MED ORDER — LIOTHYRONINE SODIUM 25 MCG PO TABS
25.0000 ug | ORAL_TABLET | Freq: Every day | ORAL | 1 refills | Status: AC
  Filled 2021-10-05 – 2021-12-17 (×3): qty 90, 90d supply, fill #0
  Filled 2022-03-13: qty 90, 90d supply, fill #1

## 2021-10-05 NOTE — Progress Notes (Signed)
Chief Complaint: Follow-up of Crohn's disease  HPI:    [name] is a 46 year old female with a past medical history as listed below including Crohn's disease and reflux, known to Dr. Silverio Decamp, who was referred to me by Sophia Arnt, MD for a complaint of Crohn's disease.      11/25/2018 patient saw Dr. Silverio Decamp at that time was overall doing better on Budesonide 9 mg daily.  Her reflux was controlled on Omeprazole daily.  At that time was because she was currently in clinical remission on Budesonide.  Given her mild Crohn's disease would recommend continued Budesonide for 6 to 12 months to maintain clinical remission.  Then consider a slow taper.  At that time holding off on chronic immunosuppressive therapy or Biologics.  It was thought Crohn's disease is likely exacerbated due to excessive use of NSAIDs in the past.  That time checked a CBC CMP CRP, folate, B12 as well as a TTG IgA.  Continued on Omeprazole.    07/18/2021 patient saw PCP and at that time was discussing a flare of her Crohn's disease.  She was started on Budesonide 9 mg daily and continued on Protonix 20 mg daily.    Today, patient explains that she has really been doing pretty good over the past few years, but most recently over the past couple of months she has been in a flare.  Tells me initially she could not sleep was having what felt like "rocks in her stomach", nausea, diarrhea and abdominal pain.  She went to see her PCP as above and was started on Budesonide and at least now she can make it through the night, she remains with diarrhea pretty much after she eats anything, some days only 2-3 bowel movements but some days more than that.  It is not waking her from her sleep.  Continues with a feeling of rocks in her stomach regardless of Protonix which she took for a few weeks.  She stopped it because she did not feel like it was doing anything.  Tells me that she thinks she is due for another EGD/colonoscopy.    Patient is a  Designer, jewellery.    Denies fever, chills, weight loss or blood in her stool.  GI history :   EGD and colonoscopy March 09, 2018 showed gastritis, biopsies negative for H. pylori.  Colon and terminal ileum appeared normal. Capsule endoscopy April 01, 2018 showed patchy erythema and small superficial erosions scattered in the small bowel.   Maternal grandfather had ulcerative colitis   She previously had EGD and colonoscopy in 2013 and 2018, was diagnosed with nonspecific colitis and also ?  Crohn's disease.  She was advised to start Humira but never started because she was concerned about being on immunosuppressive therapy as she was around patients with TB and fungal infections at her job.  Past Medical History:  Diagnosis Date   Acquired hypothyroidism 08/28/2017   Allergy    Arthritis    Crohn disease ([place])    GERD (gastroesophageal reflux disease)     Past Surgical History:  Procedure Laterality Date   THYROIDECTOMY  2015   benign nodules; done prophylactially due to strong FH of thyroid cancer in mom and GM   TUBAL LIGATION      Current Outpatient Medications  Medication Sig Dispense Refill   budesonide (ENTOCORT EC) 3 MG 24 hr capsule Take 3 capsules by mouth daily. 90 capsule 3   cholecalciferol (VITAMIN D) 1000 units tablet Take  2,000 Units by mouth daily.     fexofenadine-pseudoephedrine (ALLEGRA-D 24) 180-240 MG 24 hr tablet Take 1 tablet by mouth daily.     fluticasone (FLONASE) 50 MCG/ACT nasal spray Place 1 spray into both nostrils daily. 16 g 1   levothyroxine (SYNTHROID) 175 MCG tablet Take 1 tablet (175 mcg total) by mouth daily before breakfast. 90 tablet 3   liothyronine (CYTOMEL) 25 MCG tablet Take 1 tablet by mouth on an empty stomach once daily 90 tablet 1   liothyronine (CYTOMEL) 25 MCG tablet Take 1 tablet (25 mcg total) by mouth daily on an empty stomach. 90 tablet 1   No current facility-administered medications for this visit.    Allergies as of  10/05/2021 - Review Complete 10/05/2021  Allergen Reaction Noted   Dilaudid [hydromorphone hcl] Anaphylaxis 08/28/2017    Family History  Problem Relation Age of Onset   Rheum arthritis Mother    Diverticulitis Mother    Hypertension Mother    Obesity Mother    Thyroid cancer Mother    Arthritis Mother    Non-Hodgkin's lymphoma Father    Alcohol abuse Father    Rheum arthritis Brother    Obesity Brother    Anxiety disorder Brother    Asthma Brother    Depression Brother    Healthy Daughter    Healthy Son    Thyroid cancer Maternal Grandfather    Heart disease Maternal Grandfather    Healthy Daughter     Social History   Socioeconomic History   Marital status: Married    Spouse name: Not on file   Number of children: 3   Years of education: Not on file   Highest education level: Not on file  Occupational History   Occupation: Forensic scientist  Tobacco Use   Smoking status: Former   Smokeless tobacco: Never   Tobacco comments:    quit 2003  Vaping Use   Vaping Use: Never used  Substance and Sexual Activity   Alcohol use: Yes    Comment: occasionally    Drug use: Never   Sexual activity: Yes    Birth control/protection: Surgical    Comment: tubal  Other Topics Concern   Not on file  Social History Narrative   Husband is pulmonologist: joined [employer]   Social Determinants of Health   Financial Resource Strain: Not on file  Food Insecurity: Not on file  Transportation Needs: Not on file  Physical Activity: Not on file  Stress: Not on file  Social Connections: Not on file  Intimate Partner Violence: Not on file    Review of Systems:    Constitutional: No weight loss, fever or chills Cardiovascular: No chest pain Respiratory: No SOB  Gastrointestinal: See HPI and otherwise negative   Physical Exam:  Vital signs: BP 118/78   Pulse 80   Ht _0  (1.727 m)   Wt 200 lb (90.7 kg)   SpO2 98%   BMI 30.41 kg/m    Constitutional:   Pleasant  Caucasian female appears to be in NAD, Well developed, Well nourished, alert and cooperative Respiratory: Respirations even and unlabored. Lungs clear to auscultation bilaterally.   No wheezes, crackles, or rhonchi.  Cardiovascular: Normal S1, S2. No MRG. Regular rate and rhythm. No peripheral edema, cyanosis or pallor.  Gastrointestinal:  Soft, nondistended, mild generalized ttp. No rebound or guarding. Normal bowel sounds. No appreciable masses or hepatomegaly. Rectal:  Not performed.  Psychiatric: Oriented to person, place and time. Demonstrates good judgement and  reason without abnormal affect or behaviors.  RELEVANT LABS AND IMAGING: CBC    Component Value Date/Time   WBC 5.1 03/13/2021 0940   RBC 4.47 03/13/2021 0940   HGB 13.6 03/13/2021 0940   HCT 41.3 03/13/2021 0940   PLT 218.0 03/13/2021 0940   MCV 92.3 03/13/2021 0940   MCHC 33.1 03/13/2021 0940   RDW 13.3 03/13/2021 0940   LYMPHSABS 1.5 03/13/2021 0940   MONOABS 0.4 03/13/2021 0940   EOSABS 0.1 03/13/2021 0940   BASOSABS 0.0 03/13/2021 0940    CMP     Component Value Date/Time   NA 136 03/13/2021 0940   K 4.2 03/13/2021 0940   CL 103 03/13/2021 0940   CO2 25 03/13/2021 0940   GLUCOSE 93 03/13/2021 0940   BUN 19 03/13/2021 0940   CREATININE 0.69 03/13/2021 0940   CALCIUM 9.9 03/13/2021 0940   PROT 7.1 03/13/2021 0940   ALBUMIN 4.3 03/13/2021 0940   AST 18 03/13/2021 0940   ALT 19 03/13/2021 0940   ALKPHOS 51 03/13/2021 0940   BILITOT 0.7 03/13/2021 0940    Assessment: 1.  Crohn's disease: Thought of the small bowel, was in remission 2 years ago, EGD and colonoscopy at that time, now with flare of symptoms only minimally better on Budesonide 9 mg for the past 2 months; can that her advancement of disease  Plan: 1.  Ordered labs to include a CBC, CMP, CRP, ESR, B12 and folate. 2.  Continue Budesonide 9 mg daily for now 3.  Scheduled patient for diagnostic EGD and colonoscopy in the [facility] with Dr. Silverio Decamp.   Did provide the patient a detailed list of risks for the procedure and she agrees to proceed. Patient is appropriate for endoscopic procedure(s) in the ambulatory ([place]) setting.  4.  Pending results from above could add a Mesalamine to her current therapy.  She would like to avoid immune therapy if possible. 5.  Patient to follow in clinic per recommendations Dr. Silverio Decamp after time of procedure.  Ellouise Newer, PA-C [facility] 10/05/2021, 2:20 PM  Cc: Sophia Arnt, MD

## 2021-10-05 NOTE — Patient Instructions (Signed)
_______________________________________________________  If you are age 44 or older, your body mass index should be between 23-30. Your Body mass index is 30.41 kg/m. If this is out of the aforementioned range listed, please consider follow up with your Primary Care Provider.  If you are age 32 or younger, your body mass index should be between 19-25. Your Body mass index is 30.41 kg/m. If this is out of the aformentioned range listed, please consider follow up with your Primary Care Provider.   ________________________________________________________  The [facility] providers would like to encourage you to use [facility] to communicate with providers for non-urgent requests or questions.  Due to long hold times on the telephone, sending your provider a message by [facility] may be a faster and more efficient way to get a response.  Please allow 48 business hours for a response.  Please remember that this is for non-urgent requests.  _______________________________________________________  Sophia Blackwell have been scheduled for an endoscopy and colonoscopy. Please follow the written instructions given to you at your visit today. Please pick up your prep supplies at the pharmacy within the next 1-3 days. If you use inhalers (even only as needed), please bring them with you on the day of your procedure.  Your provider has requested that you go to the basement level for lab work before leaving today. Press "B" on the elevator. The lab is located at the first door on the left as you exit the elevator.  Due to recent changes in healthcare laws, you may see the results of your imaging and laboratory studies on MyChart before your provider has had a chance to review them.  We understand that in some cases there may be results that are confusing or concerning to you. Not all laboratory results come back in the same time frame and the provider may be waiting for multiple results in order to interpret others.  Please give  [place] 48 hours in order for your provider to thoroughly review all the results before contacting the office for clarification of your results.   It was a pleasure to see you today!  Thank you for trusting me with your gastrointestinal care!

## 2021-10-08 ENCOUNTER — Encounter: Payer: Self-pay | Admitting: *Deleted

## 2021-10-19 ENCOUNTER — Other Ambulatory Visit (HOSPITAL_COMMUNITY): Payer: Self-pay

## 2021-10-26 ENCOUNTER — Other Ambulatory Visit (HOSPITAL_COMMUNITY): Payer: Self-pay

## 2021-10-27 ENCOUNTER — Encounter: Payer: Self-pay | Admitting: Certified Registered Nurse Anesthetist

## 2021-11-02 ENCOUNTER — Encounter: Payer: Self-pay | Admitting: Gastroenterology

## 2021-11-02 ENCOUNTER — Ambulatory Visit (AMBULATORY_SURGERY_CENTER): Payer: 59 | Admitting: Gastroenterology

## 2021-11-02 VITALS — BP 147/88 | HR 62 | Temp 97.3°F | Resp 15 | Ht 68.0 in | Wt 200.0 lb

## 2021-11-02 DIAGNOSIS — D122 Benign neoplasm of ascending colon: Secondary | ICD-10-CM

## 2021-11-02 DIAGNOSIS — K626 Ulcer of anus and rectum: Secondary | ICD-10-CM

## 2021-11-02 DIAGNOSIS — R1084 Generalized abdominal pain: Secondary | ICD-10-CM | POA: Diagnosis not present

## 2021-11-02 DIAGNOSIS — K319 Disease of stomach and duodenum, unspecified: Secondary | ICD-10-CM | POA: Diagnosis not present

## 2021-11-02 DIAGNOSIS — E039 Hypothyroidism, unspecified: Secondary | ICD-10-CM | POA: Diagnosis not present

## 2021-11-02 DIAGNOSIS — K629 Disease of anus and rectum, unspecified: Secondary | ICD-10-CM | POA: Diagnosis not present

## 2021-11-02 DIAGNOSIS — R1013 Epigastric pain: Secondary | ICD-10-CM | POA: Diagnosis not present

## 2021-11-02 DIAGNOSIS — K50919 Crohn's disease, unspecified, with unspecified complications: Secondary | ICD-10-CM

## 2021-11-02 DIAGNOSIS — K2970 Gastritis, unspecified, without bleeding: Secondary | ICD-10-CM | POA: Diagnosis not present

## 2021-11-02 DIAGNOSIS — K529 Noninfective gastroenteritis and colitis, unspecified: Secondary | ICD-10-CM

## 2021-11-02 DIAGNOSIS — K259 Gastric ulcer, unspecified as acute or chronic, without hemorrhage or perforation: Secondary | ICD-10-CM

## 2021-11-02 DIAGNOSIS — G8929 Other chronic pain: Secondary | ICD-10-CM

## 2021-11-02 MED ORDER — PANTOPRAZOLE SODIUM 20 MG PO TBEC
40.0000 mg | DELAYED_RELEASE_TABLET | Freq: Two times a day (BID) | ORAL | 2 refills | Status: DC
  Filled 2021-11-02: qty 60, 15d supply, fill #0
  Filled 2021-11-20: qty 60, 15d supply, fill #1
  Filled 2021-12-05: qty 60, 15d supply, fill #2

## 2021-11-02 MED ORDER — SODIUM CHLORIDE 0.9 % IV SOLN: 500.0000 mL | INTRAVENOUS | Status: DC

## 2021-11-02 MED ORDER — MESALAMINE 1000 MG RE SUPP
1000.0000 mg | Freq: Every day | RECTAL | 0 refills | Status: DC
  Filled 2021-11-02: qty 14, 14d supply, fill #0

## 2021-11-02 MED ORDER — SUCRALFATE 1 G PO TABS
1.0000 g | ORAL_TABLET | Freq: Two times a day (BID) | ORAL | 0 refills | Status: DC
  Filled 2021-11-02: qty 28, 14d supply, fill #0

## 2021-11-02 NOTE — Progress Notes (Unsigned)
Called to room to assist during endoscopic procedure.  Patient ID and intended procedure confirmed with present staff. Received instructions for my participation in the procedure from the performing physician.  

## 2021-11-02 NOTE — Progress Notes (Unsigned)
1535 Robinul 0.1 mg IV given due large amount of secretions upon assessment.  MD made aware, vss

## 2021-11-02 NOTE — Progress Notes (Unsigned)
Report given to PACU, vss 

## 2021-11-02 NOTE — Progress Notes (Unsigned)
Pt's states no medical or surgical changes since previsit or office visit. 

## 2021-11-02 NOTE — Progress Notes (Unsigned)
[facility] History and Physical   Primary Care Physician:  Leamon Arnt, MD   Reason for Procedure:  Crohn's disease, epigastric abdominal pain  Plan:    EGD and colonoscopy with possible interventions as needed     HPI: Sophia Blackwell is a very pleasant 46 y.o. female here for EGD and colonoscopy for surveillance and assessment of disease activity with Crohn's disease.   The risks and benefits as well as alternatives of endoscopic procedure(s) have been discussed and reviewed. All questions answered. The patient agrees to proceed.    Past Medical History:  Diagnosis Date   Acquired hypothyroidism 08/28/2017   Allergy    Arthritis    Crohn disease ([place])    GERD (gastroesophageal reflux disease)     Past Surgical History:  Procedure Laterality Date   THYROIDECTOMY  2015   benign nodules; done prophylactially due to strong FH of thyroid cancer in mom and GM   TUBAL LIGATION      Prior to Admission medications   Medication Sig Start Date End Date Taking? Authorizing Provider  budesonide (ENTOCORT EC) 3 MG 24 hr capsule Take 3 capsules by mouth daily. 07/18/21  Yes Leamon Arnt, MD  cholecalciferol (VITAMIN D) 1000 units tablet Take 2,000 Units by mouth daily.   Yes [provider]  Collagen Hydrolysate, Bovine, POWD collagen (bovine)   Yes [provider]  fexofenadine-pseudoephedrine (ALLEGRA-D 24) 180-240 MG 24 hr tablet Take 1 tablet by mouth daily.   Yes [provider]  fluticasone (FLONASE) 50 MCG/ACT nasal spray Place 1 spray into both nostrils daily. 12/09/18  Yes Leamon Arnt, MD  levothyroxine (SYNTHROID) 175 MCG tablet Take 1 tablet (175 mcg total) by mouth daily before breakfast. 09/18/21 09/18/22 Yes Leamon Arnt, MD  liothyronine (CYTOMEL) 25 MCG tablet Take 1 tablet by mouth on an empty stomach once daily 07/06/21  Yes   liothyronine (CYTOMEL) 25 MCG tablet Take 1 tablet (25 mcg total) by mouth daily on an empty  stomach. 10/05/21  Yes     Current Outpatient Medications  Medication Sig Dispense Refill   budesonide (ENTOCORT EC) 3 MG 24 hr capsule Take 3 capsules by mouth daily. 90 capsule 3   cholecalciferol (VITAMIN D) 1000 units tablet Take 2,000 Units by mouth daily.     Collagen Hydrolysate, Bovine, POWD collagen (bovine)     fexofenadine-pseudoephedrine (ALLEGRA-D 24) 180-240 MG 24 hr tablet Take 1 tablet by mouth daily.     fluticasone (FLONASE) 50 MCG/ACT nasal spray Place 1 spray into both nostrils daily. 16 g 1   levothyroxine (SYNTHROID) 175 MCG tablet Take 1 tablet (175 mcg total) by mouth daily before breakfast. 90 tablet 3   liothyronine (CYTOMEL) 25 MCG tablet Take 1 tablet by mouth on an empty stomach once daily 90 tablet 1   liothyronine (CYTOMEL) 25 MCG tablet Take 1 tablet (25 mcg total) by mouth daily on an empty stomach. 90 tablet 1   Current Facility-Administered Medications  Medication Dose Route Frequency Provider Last Rate Last Admin   0.9 %  sodium chloride infusion  500 mL Intravenous Continuous Brasen Bundren, Venia Minks, MD        Allergies as of 11/02/2021 - Review Complete 11/02/2021  Allergen Reaction Noted   Dilaudid [hydromorphone hcl] Anaphylaxis 08/28/2017    Family History  Problem Relation Age of Onset   Rheum arthritis Mother    Diverticulitis Mother    Hypertension Mother    Obesity Mother  Thyroid cancer Mother    Arthritis Mother    Non-Hodgkin's lymphoma Father    Alcohol abuse Father    Rheum arthritis Brother    Obesity Brother    Anxiety disorder Brother    Asthma Brother    Depression Brother    Healthy Daughter    Healthy Son    Thyroid cancer Maternal Grandfather    Heart disease Maternal Grandfather    Healthy Daughter     Social History   Socioeconomic History   Marital status: Married    Spouse name: Not on file   Number of children: 3   Years of education: Not on file   Highest education level: Not on file  Occupational  History   Occupation: Forensic scientist  Tobacco Use   Smoking status: Former   Smokeless tobacco: Never   Tobacco comments:    quit 2003  Vaping Use   Vaping Use: Never used  Substance and Sexual Activity   Alcohol use: Yes    Comment: occasionally    Drug use: Never   Sexual activity: Yes    Birth control/protection: Surgical    Comment: tubal  Other Topics Concern   Not on file  Social History Narrative   Husband is pulmonologist: 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   Social Determinants of Health   Financial Resource Strain: Not on file  Food Insecurity: Not on file  Transportation Needs: Not on file  Physical Activity: Not on file  Stress: Not on file  Social Connections: Not on file  Intimate Partner Violence: Not on file    Review of Systems:  All other review of systems negative except as mentioned in the HPI.  Physical Exam: Vital signs in last 24 hours: Blood Pressure 124/71   Pulse 66   Temperature (Abnormal) 97.3 F (36.3 C)   Height 5' 8"  (1.727 m)   Weight 200 lb (90.7 kg)   Oxygen Saturation 99%   Body Mass Index 30.41 kg/m  General:   Alert, NAD Lungs:  Clear .   Heart:  Regular rate and rhythm Abdomen:  Soft, nontender and nondistended. Neuro/Psych:  Alert and cooperative. Normal mood and affect. A and O x 3  Reviewed labs, radiology imaging, old records and pertinent past GI work up  Patient is appropriate for planned procedure(s) and anesthesia in an ambulatory setting   K. Denzil Magnuson , MD [phone]

## 2021-11-02 NOTE — Op Note (Signed)
[place] Patient Name: Sophia Blackwell Procedure Date: 11/02/2021 3:30 PM MRN: [id number] Endoscopist: Mauri Pole , MD Age: 46 Referring MD:  Date of Birth: [birth year] Gender: Female Account #: [id number] Procedure:                Upper GI endoscopy Indications:              Epigastric abdominal pain Medicines:                Monitored Anesthesia Care Procedure:                Pre-Anesthesia Assessment:                           - Prior to the procedure, a History and Physical                            was performed, and patient medications and                            allergies were reviewed. The patient's tolerance of                            previous anesthesia was also reviewed. The risks                            and benefits of the procedure and the sedation                            options and risks were discussed with the patient.                            All questions were answered, and informed consent                            was obtained. Prior Anticoagulants: The patient has                            taken no previous anticoagulant or antiplatelet                            agents. ASA Grade Assessment: II - A patient with                            mild systemic disease. After reviewing the risks                            and benefits, the patient was deemed in                            satisfactory condition to undergo the procedure.                           After obtaining informed consent, the endoscope was  passed under direct vision. Throughout the                            procedure, the patient's blood pressure, pulse, and                            oxygen saturations were monitored continuously. The                            GIF [id number] #[id number] was introduced through the                            mouth, and advanced to the second part of duodenum.                            The upper GI endoscopy  was accomplished without                            difficulty. The patient tolerated the procedure                            well. Scope In: Scope Out: Findings:                 The Z-line was regular and was found 38 cm from the                            incisors.                           The examined esophagus was normal.                           Patchy mild inflammation characterized by                            congestion (edema), erosions and erythema was found                            in the entire examined stomach. Biopsies were taken                            with a cold forceps for histology. Biopsies were                            taken with a cold forceps for Helicobacter pylori                            testing.                           Two non-bleeding cratered gastric ulcers with no                            stigmata of bleeding were found in the prepyloric  region of the stomach. The largest lesion was 1 mm                            in largest dimension.                           The cardia and gastric fundus were normal on                            retroflexion.                           The examined duodenum was normal. Complications:            No immediate complications. Estimated Blood Loss:     Estimated blood loss: none. Impression:               - Z-line regular, 38 cm from the incisors.                           - Normal esophagus.                           - Gastritis. Biopsied.                           - Non-bleeding gastric ulcers with no stigmata of                            bleeding.                           - Normal examined duodenum. Recommendation:           - Patient has a contact number available for                            emergencies. The signs and symptoms of potential                            delayed complications were discussed with the                            patient. Return to normal  activities tomorrow.                            Written discharge instructions were provided to the                            patient.                           - Resume previous diet.                           - Continue present medications.                           -  Await pathology results.                           - Use Protonix (pantoprazole) 40 mg PO BID for 3                            months followed by 3m once daily.                           - Use sucralfate tablets 1 gram PO BID for 2 weeks.                           - See the other procedure note for documentation of                            additional recommendations. KMauri Pole MD 11/02/2021 4:29:36 PM This report has been signed electronically.

## 2021-11-02 NOTE — Patient Instructions (Addendum)
Pick up new Rx's  Continue present medications except wean off Budesonide as explained in your procedure report.  Office visit in 2-4 weeks.  Use benefiber 2 tsp BID.    Continue Hyoscamine or simethicone as needed tonight for gas/bloating.   YOU HAD AN ENDOSCOPIC PROCEDURE TODAY AT [facility]:   Refer to the procedure report that was given to you for any specific questions about what was found during the examination.  If the procedure report does not answer your questions, please call your gastroenterologist to clarify.  If you requested that your care partner not be given the details of your procedure findings, then the procedure report has been included in a sealed envelope for you to review at your convenience later.  YOU SHOULD EXPECT: Some feelings of bloating in the abdomen. Passage of more gas than usual.  Walking can help get rid of the air that was put into your GI tract during the procedure and reduce the bloating. If you had a lower endoscopy (such as a colonoscopy or flexible sigmoidoscopy) you may notice spotting of blood in your stool or on the toilet paper. If you underwent a bowel prep for your procedure, you may not have a normal bowel movement for a few days.  Please Note:  You might notice some irritation and congestion in your nose or some drainage.  This is from the oxygen used during your procedure.  There is no need for concern and it should clear up in a day or so.  SYMPTOMS TO REPORT IMMEDIATELY:  Following lower endoscopy (colonoscopy):  Excessive amounts of blood in the stool  Significant tenderness or worsening of abdominal pains  Swelling of the abdomen that is new, acute  Fever of 100F or higher  Following upper endoscopy (EGD)  Vomiting of blood or coffee ground material  New chest pain or pain under the shoulder blades  Painful or persistently difficult swallowing  New shortness of breath  Black, tarry-looking stools  For urgent or  emergent issues, a gastroenterologist can be reached at any hour by calling [phone]. Do not use MyChart messaging for urgent concerns.    DIET:  We do recommend a small meal at first, but then you may proceed to your regular diet.  Drink plenty of fluids but you should avoid alcoholic beverages for 24 hours.  ACTIVITY:  You should plan to take it easy for the rest of today and you should NOT DRIVE or use heavy machinery until tomorrow (because of the sedation medicines used during the test).    FOLLOW UP: Our staff will call the number listed on your records the next business day following your procedure.  We will call around 7:15- 8:00 am to check on you and address any questions or concerns that you may have regarding the information given to you following your procedure. If we do not reach you, we will leave a message.     If any biopsies were taken you will be contacted by phone or by letter within the next 1-3 weeks.  Please call us at [phone] if you have not heard about the biopsies in 3 weeks.    SIGNATURES/CONFIDENTIALITY: You and/or your care partner have signed paperwork which will be entered into your electronic medical record.  These signatures attest to the fact that that the information above on your After Visit Summary has been reviewed and is understood.  Full responsibility of the confidentiality of this discharge information lies with  you and/or your care-partner.

## 2021-11-02 NOTE — Op Note (Signed)
[place] Patient Name: Sophia Blackwell Procedure Date: 11/02/2021 3:30 PM MRN: [id number] Endoscopist: Mauri Pole , MD Age: 46 Referring MD:  Date of Birth: [birth year] Gender: Female Account #: [id number] Procedure:                Colonoscopy Indications:              Generalized abdominal pain, Follow-up of Crohn's                            disease and disease activity Medicines:                Propofol per Anesthesia Procedure:                Pre-Anesthesia Assessment:                           - Prior to the procedure, a History and Physical                            was performed, and patient medications and                            allergies were reviewed. The patient's tolerance of                            previous anesthesia was also reviewed. The risks                            and benefits of the procedure and the sedation                            options and risks were discussed with the patient.                            All questions were answered, and informed consent                            was obtained. Prior Anticoagulants: The patient has                            taken no previous anticoagulant or antiplatelet                            agents. ASA Grade Assessment: II - A patient with                            mild systemic disease. After reviewing the risks                            and benefits, the patient was deemed in                            satisfactory condition to undergo the procedure.  After obtaining informed consent, the colonoscope                            was passed under direct vision. Throughout the                            procedure, the patient's blood pressure, pulse, and                            oxygen saturations were monitored continuously. The                            Olympus PCF-[id number] (#[id number]) Colonoscope was                            introduced through the anus and  advanced to the the                            terminal ileum, with identification of the                            appendiceal orifice and IC valve. The colonoscopy                            was performed without difficulty. The patient                            tolerated the procedure well. The quality of the                            bowel preparation was fair. The terminal ileum,                            ileocecal valve, appendiceal orifice, and rectum                            were photographed. Scope In: 3:51:47 PM Scope Out: 4:14:45 PM Scope Withdrawal Time: 0 hours 17 minutes 31 seconds  Total Procedure Duration: 0 hours 22 minutes 58 seconds  Findings:                 The perianal and digital rectal examinations were                            normal.                           A 3 mm polyp was found in the ascending colon. The                            polyp was sessile. The polyp was removed with a                            cold snare. Resection and retrieval were complete.  The terminal ileum appeared normal. Biopsies were                            taken with a cold forceps for histology.                           A single (solitary) ten mm ulcer was found in the                            rectum. No bleeding was present. Biopsies were                            taken with a cold forceps for histology.                           The exam was otherwise normal throughout the                            examined colon. Biopsies were taken with a cold                            forceps for histology.                           Non-bleeding external and internal hemorrhoids were                            found during retroflexion. The hemorrhoids were                            medium-sized. Complications:            No immediate complications. Estimated Blood Loss:     Estimated blood loss was minimal. Impression:               - Preparation of  the colon was fair.                           - One 3 mm polyp in the ascending colon, removed                            with a cold snare. Resected and retrieved.                           - The examined portion of the ileum was normal.                            Biopsied.                           - A single (solitary) ulcer in the rectum. Biopsied.                           - Non-bleeding external and internal hemorrhoids. Recommendation:           - Patient has  a contact number available for                            emergencies. The signs and symptoms of potential                            delayed complications were discussed with the                            patient. Return to normal activities tomorrow.                            Written discharge instructions were provided to the                            patient.                           - Resume previous diet.                           - Continue present medications.                           - Await pathology results.                           - Use Benefiber two teaspoons PO BID.                           - Use Canasa 1000 mg suppository 1 per rectum QHS                            for 2 weeks.                           - Taper Budesonide to 81m daily X 1 week and then                            325mdaily X 1 week and stop                           - Follow up in office visit in 2-4 weeks                           - If biopsies consistent with inflammatory bowel                            disease will plan to start biologic, Rinvoq pending                            insurnace approval KaMauri PoleMD 11/02/2021 4:26:15 PM This report has been signed electronically.

## 2021-11-03 ENCOUNTER — Other Ambulatory Visit (HOSPITAL_COMMUNITY): Payer: Self-pay

## 2021-11-03 MED ORDER — DICYCLOMINE HCL 10 MG PO CAPS
10.0000 mg | ORAL_CAPSULE | Freq: Four times a day (QID) | ORAL | 3 refills | Status: AC | PRN
  Filled 2021-11-03: qty 90, 23d supply, fill #0

## 2021-11-05 ENCOUNTER — Telehealth: Payer: Self-pay | Admitting: *Deleted

## 2021-11-05 NOTE — Telephone Encounter (Signed)
  Follow up Call-     11/02/2021    2:20 PM  Call back number  Post procedure Call Back phone  # [phone]  Permission to leave phone message Yes     Patient questions:  Do you have a fever, pain , or abdominal swelling? No. Pain Score  0 *  Have you tolerated food without any problems? Yes.    Have you been able to return to your normal activities? Yes.    Do you have any questions about your discharge instructions: Diet   No. Medications  No. Follow up visit  No.  Do you have questions or concerns about your Care? No.  Actions: * If pain score is 4 or above: No action needed, pain <4.

## 2021-11-06 ENCOUNTER — Encounter: Payer: Self-pay | Admitting: Gastroenterology

## 2021-11-06 ENCOUNTER — Other Ambulatory Visit: Payer: Self-pay

## 2021-11-06 ENCOUNTER — Telehealth: Payer: Self-pay

## 2021-11-06 DIAGNOSIS — R110 Nausea: Secondary | ICD-10-CM

## 2021-11-06 DIAGNOSIS — R1084 Generalized abdominal pain: Secondary | ICD-10-CM

## 2021-11-06 NOTE — Telephone Encounter (Signed)
RUQ order in Epic. Message to the scheduling department.  Called the patient to offer 11/22/21 at 11:20 am. No answer. Left a message asking she call to confirm or decline. Alternately she can send a message through My Chart.

## 2021-11-06 NOTE — Telephone Encounter (Signed)
-----   Message from Mauri Pole, MD sent at 11/02/2021  4:38 PM EDT ----- Can you please add her in one of the days for me in next 2-4 weeks for office visit.  She needs RUQ ultrasound for epigastric pain to r/o gallbladder disease Thank you VN

## 2021-11-07 ENCOUNTER — Other Ambulatory Visit (HOSPITAL_COMMUNITY): Payer: Self-pay

## 2021-11-20 ENCOUNTER — Ambulatory Visit (HOSPITAL_BASED_OUTPATIENT_CLINIC_OR_DEPARTMENT_OTHER)
Admission: RE | Admit: 2021-11-20 | Discharge: 2021-11-20 | Disposition: A | Discharge status: Home / Self Care | Payer: 59 | Source: Ambulatory Visit | Attending: Gastroenterology | Admitting: Gastroenterology

## 2021-11-20 ENCOUNTER — Other Ambulatory Visit: Payer: Self-pay | Admitting: Gastroenterology

## 2021-11-20 DIAGNOSIS — K8020 Calculus of gallbladder without cholecystitis without obstruction: Secondary | ICD-10-CM | POA: Diagnosis not present

## 2021-11-20 DIAGNOSIS — R109 Unspecified abdominal pain: Secondary | ICD-10-CM | POA: Diagnosis not present

## 2021-11-20 DIAGNOSIS — R110 Nausea: Secondary | ICD-10-CM | POA: Insufficient documentation

## 2021-11-20 DIAGNOSIS — R1084 Generalized abdominal pain: Secondary | ICD-10-CM | POA: Insufficient documentation

## 2021-11-21 ENCOUNTER — Other Ambulatory Visit (HOSPITAL_COMMUNITY): Payer: Self-pay

## 2021-11-22 ENCOUNTER — Encounter: Payer: Self-pay | Admitting: Gastroenterology

## 2021-11-22 ENCOUNTER — Other Ambulatory Visit (HOSPITAL_COMMUNITY): Payer: Self-pay

## 2021-11-22 ENCOUNTER — Ambulatory Visit: Payer: 59 | Admitting: Gastroenterology

## 2021-11-22 VITALS — BP 120/80 | HR 78 | Ht 68.0 in | Wt 206.0 lb

## 2021-11-22 DIAGNOSIS — R1013 Epigastric pain: Secondary | ICD-10-CM | POA: Diagnosis not present

## 2021-11-22 DIAGNOSIS — K9089 Other intestinal malabsorption: Secondary | ICD-10-CM | POA: Diagnosis not present

## 2021-11-22 DIAGNOSIS — K580 Irritable bowel syndrome with diarrhea: Secondary | ICD-10-CM | POA: Diagnosis not present

## 2021-11-22 DIAGNOSIS — K8020 Calculus of gallbladder without cholecystitis without obstruction: Secondary | ICD-10-CM

## 2021-11-22 MED ORDER — COLESEVELAM HCL 625 MG PO TABS
625.0000 mg | ORAL_TABLET | Freq: Every day | ORAL | 0 refills | Status: DC
  Filled 2021-11-22: qty 30, 30d supply, fill #0

## 2021-11-22 NOTE — Progress Notes (Signed)
Sophia Blackwell    [id number]    January 24, 1975  Primary Care Physician:Andy, Karie Fetch, MD  Referring Physician: Leamon Arnt, [address] [name],  [place] [address]   Chief complaint:  epigastric abdominal pain  HPI:  46 year old very pleasant female here for follow-up visit for rectal ulcer and upper abdominal discomfort and dyspepsia  She has history of aphthous ulcers in terminal ileum in the setting of NSAID use, ?  Crohn's disease , it is unclear as the biopsies are non diagnostic  Rectal discomfort and tenesmus has improved s/p Canasa suppositories.  Prior to using Canasa, she was having constant urge to defecate, rectal pain/discomfort and intermittent bright red blood per rectum for almost past 1 year.  Her symptoms have improved this past few days  She had 2 severe episodes of epigastric abdominal pain radiating to the back, last episode was a month ago.  She was almost considering coming to ER due to severe pain. She has had intermittent episodes of upper abdominal discomfort, abdominal bloating, indigestion and dyspepsia on and off for past few years  RUQ abd ultrasound 11/20/21 Cholelithiasis measuring up to 5 mm. No gallbladder wall thickening or pericholecystic fluid. Negative sonographic Murphy's sign.  She is taking pantoprazole and Carafate, is off budesonide  Colonoscopy November 02, 2021 - Preparation of the colon was fair. - One 3 mm polyp in the ascending colon, removed with a cold snare. Resected and retrieved. - The examined portion of the ileum was normal. Biopsied. - A single (solitary) ulcer in the rectum. Biopsied. - Non-bleeding external and internal hemorrhoids.   Outpatient Encounter Medications as of 11/22/2021  Medication Sig   budesonide (ENTOCORT EC) 3 MG 24 hr capsule Take 3 capsules by mouth daily.   cholecalciferol (VITAMIN D) 1000 units tablet Take 2,000 Units by mouth daily.   Collagen Hydrolysate, Bovine, POWD  collagen (bovine)   dicyclomine (BENTYL) 10 MG capsule Take 1 capsule (10 mg total) by mouth 4 (four) times daily as needed for spasms.   fexofenadine-pseudoephedrine (ALLEGRA-D 24) 180-240 MG 24 hr tablet Take 1 tablet by mouth daily.   fluticasone (FLONASE) 50 MCG/ACT nasal spray Place 1 spray into both nostrils daily.   levothyroxine (SYNTHROID) 175 MCG tablet Take 1 tablet (175 mcg total) by mouth daily before breakfast.   liothyronine (CYTOMEL) 25 MCG tablet Take 1 tablet by mouth on an empty stomach once daily   liothyronine (CYTOMEL) 25 MCG tablet Take 1 tablet (25 mcg total) by mouth daily on an empty stomach.   pantoprazole (PROTONIX) 20 MG tablet Take 2 tablets (40 mg total) by mouth 2 (two) times daily.   mesalamine (CANASA) 1000 MG suppository Place 1 suppository (1,000 mg total) rectally at bedtime for 14 days. (Patient not taking: Reported on 11/22/2021)   sucralfate (CARAFATE) 1 g tablet Take 1 tablet (1 g total) by mouth 2 (two) times daily for 14 days.   No facility-administered encounter medications on file as of 11/22/2021.    Allergies as of 11/22/2021 - Review Complete 11/22/2021  Allergen Reaction Noted   Dilaudid [hydromorphone hcl] Anaphylaxis 08/28/2017    Past Medical History:  Diagnosis Date   Acquired hypothyroidism 08/28/2017   Allergy    Arthritis    Crohn disease ([place])    GERD (gastroesophageal reflux disease)     Past Surgical History:  Procedure Laterality Date   THYROIDECTOMY  2015   benign nodules; done prophylactially due  to strong FH of thyroid cancer in mom and GM   TUBAL LIGATION      Family History  Problem Relation Age of Onset   Rheum arthritis Mother    Diverticulitis Mother    Hypertension Mother    Obesity Mother    Thyroid cancer Mother    Arthritis Mother    Non-Hodgkin's lymphoma Father    Alcohol abuse Father    Rheum arthritis Brother    Obesity Brother    Anxiety disorder Brother    Asthma Brother    Depression Brother     Healthy Daughter    Healthy Son    Thyroid cancer Maternal Grandfather    Heart disease Maternal Grandfather    Healthy Daughter     Social History   Socioeconomic History   Marital status: Married    Spouse name: Not on file   Number of children: 3   Years of education: Not on file   Highest education level: Not on file  Occupational History   Occupation: Forensic scientist  Tobacco Use   Smoking status: Former   Smokeless tobacco: Never   Tobacco comments:    quit 2003  Vaping Use   Vaping Use: Never used  Substance and Sexual Activity   Alcohol use: Yes    Comment: occasionally    Drug use: Never   Sexual activity: Yes    Birth control/protection: Surgical    Comment: tubal  Other Topics Concern   Not on file  Social History Narrative   Husband is pulmonologist: joined [employer]   Social Determinants of Health   Financial Resource Strain: Not on file  Food Insecurity: Not on file  Transportation Needs: Not on file  Physical Activity: Not on file  Stress: Not on file  Social Connections: Not on file  Intimate Partner Violence: Not on file      Review of systems: All other review of systems negative except as mentioned in the HPI.   Physical Exam: Vitals:   11/22/21 1112  BP: 120/80  Pulse: 78   Body mass index is 31.32 kg/m. Gen:      No acute distress HEENT:  sclera anicteric Abd:      soft, non-tender; no palpable masses, no distension Ext:    No edema Neuro: alert and oriented x 3 Psych: normal mood and affect Rectal exam: Normal anal sphincter tone, no anal fissure or external hemorrhoids Anoscopy: Small internal hemorrhoids, no active bleeding, normal dentate line, no visible nodules  Data Reviewed:  Reviewed labs, radiology imaging, old records and pertinent past GI work up   Assessment and Plan/Recommendations:  46 year old very pleasant female with rectal ulcer, likely stercoral s/p Canasa suppositories with mucosal  healing  Biopsies were negative for inflammatory bowel disease or any rectal neoplasia 1 small tubular adenoma was removed from ascending colon Recall colonoscopy in 5 years in October 2028  Cholelithiasis, upper abdominal discomfort and dyspepsia: We will obtain HIDA scan to exclude gallbladder dysfunction or chronic cholecystitis  Erosive gastritis and superficial gastric ulcers: Continue pantoprazole twice daily for 2-3 months and then will decrease to once daily Cannot exclude bile salt induced gastritis Biopsies were negative for H. 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 The patient agreed with the plan and demonstrated an understanding of the instructions.  Damaris Hippo , MD    CC: Leamon Arnt, MD

## 2021-11-22 NOTE — Patient Instructions (Addendum)
Start lactose free diet x 2 weeks. If no improvement in your symptoms then start Welchol daily. A prescription has been sent to your pharmacy.  Continue pantoprazole daily x 2 months.   You have been scheduled for a HIDA scan at [facility] (1st floor) on 12/12/21. Please arrive 30 minutes prior to your scheduled appointment at  1:24PY. Make certain not to have anything to eat or drink at least 6 hours prior to your test. Should this appointment date or time not work well for you, please call radiology scheduling at [phone].  _____________________________________________________________________ hepatobiliary (HIDA) scan is an imaging procedure used to diagnose problems in the liver, gallbladder and bile ducts. In the HIDA scan, a radioactive chemical or tracer is injected into a vein in your arm. The tracer is handled by the liver like bile. Bile is a fluid produced and excreted by your liver that helps your digestive system break down fats in the foods you eat. Bile is stored in your gallbladder and the gallbladder releases the bile when you eat a meal. A special nuclear medicine scanner (gamma camera) tracks the flow of the tracer from your liver into your gallbladder and small intestine.  During your HIDA scan  You'll be asked to change into a hospital gown before your HIDA scan begins. Your health care team will position you on a table, usually on your back. The radioactive tracer is then injected into a vein in your arm.The tracer travels through your bloodstream to your liver, where it's taken up by the bile-producing cells. The radioactive tracer travels with the bile from your liver into your gallbladder and through your bile ducts to your small intestine.You may feel some pressure while the radioactive tracer is injected into your vein. As you lie on the table, a special gamma camera is positioned over your abdomen taking pictures of the tracer as it moves through your body. The gamma  camera takes pictures continually for about an hour. You'll need to keep still during the HIDA scan. This can become uncomfortable, but you may find that you can lessen the discomfort by taking deep breaths and thinking about other things. Tell your health care team if you're uncomfortable. The radiologist will watch on a computer the progress of the radioactive tracer through your body. The HIDA scan may be stopped when the radioactive tracer is seen in the gallbladder and enters your small intestine. This typically takes about an hour. In some cases extra imaging will be performed if original images aren't satisfactory, if morphine is given to help visualize the gallbladder or if the medication CCK is given to look at the contraction of the gallbladder. This test typically takes 2 hours to complete. ________________________________________________________________________  The [place] [facility] providers would like to encourage you to use [facility] to communicate with providers for non-urgent requests or questions.  Due to long hold times on the telephone, sending your provider a message by [facility] may be a faster and more efficient way to get a response.  Please allow 48 business hours for a response.  Please remember that this is for non-urgent requests.   Due to recent changes in healthcare laws, you may see the results of your imaging and laboratory studies on MyChart before your provider has had a chance to review them.  We understand that in some cases there may be results that are confusing or concerning to you. Not all laboratory results come back in the same time frame and the provider may  be waiting for multiple results in order to interpret others.  Please give [place] 48 hours in order for your provider to thoroughly review all the results before contacting the office for clarification of your results.

## 2021-11-28 ENCOUNTER — Other Ambulatory Visit (HOSPITAL_COMMUNITY): Payer: Self-pay

## 2021-12-05 ENCOUNTER — Other Ambulatory Visit (HOSPITAL_COMMUNITY): Payer: Self-pay

## 2021-12-10 ENCOUNTER — Encounter: Payer: Self-pay | Admitting: Gastroenterology

## 2021-12-11 ENCOUNTER — Other Ambulatory Visit (HOSPITAL_COMMUNITY): Payer: 59

## 2021-12-12 ENCOUNTER — Ambulatory Visit (HOSPITAL_COMMUNITY)
Admission: RE | Admit: 2021-12-12 | Discharge: 2021-12-12 | Disposition: A | Discharge status: Home / Self Care | Payer: 59 | Source: Ambulatory Visit | Attending: Gastroenterology | Admitting: Gastroenterology

## 2021-12-12 DIAGNOSIS — K8020 Calculus of gallbladder without cholecystitis without obstruction: Secondary | ICD-10-CM

## 2021-12-12 DIAGNOSIS — K9089 Other intestinal malabsorption: Secondary | ICD-10-CM

## 2021-12-12 DIAGNOSIS — R1013 Epigastric pain: Secondary | ICD-10-CM

## 2021-12-12 DIAGNOSIS — K8050 Calculus of bile duct without cholangitis or cholecystitis without obstruction: Secondary | ICD-10-CM | POA: Diagnosis not present

## 2021-12-12 MED ORDER — TECHNETIUM TC 99M MEBROFENIN IV KIT: 5.3000 | PACK | Freq: Once | INTRAVENOUS | Status: DC | PRN

## 2021-12-17 ENCOUNTER — Other Ambulatory Visit (HOSPITAL_COMMUNITY): Payer: Self-pay

## 2021-12-27 ENCOUNTER — Encounter: Payer: Self-pay | Admitting: *Deleted

## 2022-01-04 ENCOUNTER — Other Ambulatory Visit (HOSPITAL_COMMUNITY): Payer: Self-pay

## 2022-01-04 ENCOUNTER — Other Ambulatory Visit: Payer: Self-pay | Admitting: Gastroenterology

## 2022-01-04 MED ORDER — PANTOPRAZOLE SODIUM 20 MG PO TBEC
40.0000 mg | DELAYED_RELEASE_TABLET | Freq: Two times a day (BID) | ORAL | 2 refills | Status: DC
  Filled 2022-01-04: qty 60, 15d supply, fill #0
  Filled 2022-03-13: qty 60, 15d supply, fill #1
  Filled 2022-04-19 – 2022-04-20 (×2): qty 60, 15d supply, fill #2

## 2022-01-08 ENCOUNTER — Other Ambulatory Visit: Payer: Self-pay

## 2022-01-15 ENCOUNTER — Other Ambulatory Visit (HOSPITAL_COMMUNITY): Payer: Self-pay

## 2022-01-26 ENCOUNTER — Other Ambulatory Visit: Payer: Self-pay

## 2022-02-14 ENCOUNTER — Encounter: Payer: Self-pay | Admitting: Gastroenterology

## 2022-02-14 ENCOUNTER — Ambulatory Visit: Payer: Commercial Managed Care - PPO | Admitting: Gastroenterology

## 2022-02-14 VITALS — BP 116/64 | HR 73 | Ht 67.0 in | Wt 215.0 lb

## 2022-02-14 DIAGNOSIS — K8020 Calculus of gallbladder without cholecystitis without obstruction: Secondary | ICD-10-CM | POA: Diagnosis not present

## 2022-02-14 DIAGNOSIS — K219 Gastro-esophageal reflux disease without esophagitis: Secondary | ICD-10-CM

## 2022-02-14 DIAGNOSIS — K2950 Unspecified chronic gastritis without bleeding: Secondary | ICD-10-CM

## 2022-02-14 NOTE — Progress Notes (Signed)
Sophia Blackwell    YU:[id number]    04-17-1975  Primary Care Physician:Andy, Karie Fetch, MD  Referring Physician: Leamon Arnt, [place],  [place] [address]   Chief complaint:  Abdominal pain, diarrhea  HPI:  47 year old very pleasant female here for follow-up visit for rectal ulcer and upper abdominal discomfort and dyspepsia  Overall she is doing much better.  She took WelChol for few weeks postprocedure and has stopped it.  She is currently taking over-the-counter digestive enzymes with improvement of bowel habits   She has history of aphthous ulcers in terminal ileum in the setting of NSAID use, ?  Crohn's disease , it is unclear as the biopsies are non diagnostic   Rectal discomfort and tenesmus has improved s/p Canasa suppositories.  Prior to using Canasa, she was having constant urge to defecate, rectal pain/discomfort and intermittent bright red blood per rectum for almost past 1 year.  Her symptoms have improved this past few days   She had 2 severe episodes of epigastric abdominal pain radiating to the back, around October or November 2023.  She was almost considering coming to ER due to severe pain. She has had intermittent episodes of upper abdominal discomfort, abdominal bloating, indigestion and dyspepsia on and off for past few years   RUQ abd ultrasound 11/20/21 Cholelithiasis measuring up to 5 mm. No gallbladder wall thickening or pericholecystic fluid. Negative sonographic Murphy's sign.   Colonoscopy November 02, 2021 - Preparation of the colon was fair. - One 3 mm polyp in the ascending colon, removed with a cold snare. Resected and retrieved. - The examined portion of the ileum was normal. Biopsied. - A single (solitary) ulcer in the rectum. Biopsied. - Non-bleeding external and internal hemorrhoids.    Outpatient Encounter Medications as of 02/14/2022  Medication Sig   cholecalciferol (VITAMIN D) 1000 units tablet Take  2,000 Units by mouth daily.   Collagen Hydrolysate, Bovine, POWD collagen (bovine)   dicyclomine (BENTYL) 10 MG capsule Take 1 capsule (10 mg total) by mouth 4 (four) times daily as needed for spasms.   fexofenadine-pseudoephedrine (ALLEGRA-D 24) 180-240 MG 24 hr tablet Take 1 tablet by mouth daily.   fluticasone (FLONASE) 50 MCG/ACT nasal spray Place 1 spray into both nostrils daily.   levothyroxine (SYNTHROID) 175 MCG tablet Take 1 tablet (175 mcg total) by mouth daily before breakfast.   liothyronine (CYTOMEL) 25 MCG tablet Take 1 tablet (25 mcg total) by mouth daily on an empty stomach.   pantoprazole (PROTONIX) 20 MG tablet Take 2 tablets (40 mg total) by mouth 2 (two) times daily.   [DISCONTINUED] budesonide (ENTOCORT EC) 3 MG 24 hr capsule Take 3 capsules by mouth daily.   [DISCONTINUED] colesevelam (WELCHOL) 625 MG tablet Take 1 tablet (625 mg total) by mouth at bedtime.   [DISCONTINUED] liothyronine (CYTOMEL) 25 MCG tablet Take 1 tablet by mouth on an empty stomach once daily   [DISCONTINUED] mesalamine (CANASA) 1000 MG suppository Place 1 suppository (1,000 mg total) rectally at bedtime for 14 days. (Patient not taking: Reported on 11/22/2021)   [DISCONTINUED] sucralfate (CARAFATE) 1 g tablet Take 1 tablet (1 g total) by mouth 2 (two) times daily for 14 days.   No facility-administered encounter medications on file as of 02/14/2022.    Allergies as of 02/14/2022 - Review Complete 02/14/2022  Allergen Reaction Noted   Dilaudid [hydromorphone hcl] Anaphylaxis 08/28/2017  Past Medical History:  Diagnosis Date   Acquired hypothyroidism 08/28/2017   Allergy    Arthritis    Crohn disease ([place])    GERD (gastroesophageal reflux disease)     Past Surgical History:  Procedure Laterality Date   THYROIDECTOMY  2015   benign nodules; done prophylactially due to strong FH of thyroid cancer in mom and GM   TUBAL LIGATION      Family History  Problem Relation Age of Onset   Rheum  arthritis Mother    Diverticulitis Mother    Hypertension Mother    Obesity Mother    Thyroid cancer Mother    Arthritis Mother    Non-Hodgkin's lymphoma Father    Alcohol abuse Father    Rheum arthritis Brother    Obesity Brother    Anxiety disorder Brother    Asthma Brother    Depression Brother    Healthy Daughter    Healthy Son    Thyroid cancer Maternal Grandfather    Heart disease Maternal Grandfather    Healthy Daughter     Social History   Socioeconomic History   Marital status: Married    Spouse name: Not on file   Number of children: 3   Years of education: Not on file   Highest education level: Not on file  Occupational History   Occupation: Forensic scientist  Tobacco Use   Smoking status: Former   Smokeless tobacco: Never   Tobacco comments:    quit 2003  Vaping Use   Vaping Use: Never used  Substance and Sexual Activity   Alcohol use: Yes    Comment: occasionally    Drug use: Never   Sexual activity: Yes    Birth control/protection: Surgical    Comment: tubal  Other Topics Concern   Not on file  Social History Narrative   Husband is pulmonologist: joined [employer]   Social Determinants of Health   Financial Resource Strain: Not on file  Food Insecurity: Not on file  Transportation Needs: Not on file  Physical Activity: Not on file  Stress: Not on file  Social Connections: Not on file  Intimate Partner Violence: Not on file      Review of systems: All other review of systems negative except as mentioned in the HPI.   Physical Exam: Vitals:   02/14/22 1041  BP: 116/64  Pulse: 73   Body mass index is 33.67 kg/m. Gen:      No acute distress HEENT:  sclera anicteric Abd:      soft, non-tender; no palpable masses, no distension Ext:    No edema Neuro: alert and oriented x 3 Psych: normal mood and affect  Data Reviewed:  Reviewed labs, radiology imaging, old records and pertinent past GI work up   Assessment and  Plan/Recommendations:  47 year old very pleasant female with rectal ulcer, likely stercoral s/p Canasa suppositories with mucosal healing   Biopsies were negative for inflammatory bowel disease or any rectal neoplasia 1 small tubular adenoma was removed from ascending colon Recall colonoscopy in 5 years in October 2028   Cholelithiasis, upper abdominal discomfort and dyspepsia HIDA scan negative for cholecystitis or gallbladder dysfunction  Will refer to Dr. Barry Dienes to discuss possible cholecystectomy if she continues to have persistent recurrent epigastric abdominal pain concerning for gallstone cholecystitis  Erosive gastritis and superficial gastric ulcers: Use pantoprazole 20 mg daily Avoid NSAIDs   IBS diarrhea vs ?Crohn's disease: Improved with digestive enzymes, will continue to monitor   Return in  6 months   The patient was provided an opportunity to ask questions and all were answered. The patient agreed with the plan and demonstrated an understanding of the instructions.  Damaris Hippo , MD    CC: Leamon Arnt, MD

## 2022-02-14 NOTE — Patient Instructions (Addendum)
We will refer you to Dr Barry Dienes and they will contact you with an appointment.   _______________________________________________________  If your blood pressure at your visit was 140/90 or greater, please contact your primary care physician to follow up on this.  _______________________________________________________  If you are age 46 or older, your body mass index should be between 23-30. Your Body mass index is 33.67 kg/m. If this is out of the aforementioned range listed, please consider follow up with your Primary Care Provider.  If you are age 4 or younger, your body mass index should be between 19-25. Your Body mass index is 33.67 kg/m. If this is out of the aformentioned range listed, please consider follow up with your Primary Care Provider.   ________________________________________________________  The [place] [facility] providers would like to encourage you to use [facility] to communicate with providers for non-urgent requests or questions.  Due to long hold times on the telephone, sending your provider a message by [facility] may be a faster and more efficient way to get a response.  Please allow 48 business hours for a response.  Please remember that this is for non-urgent requests.  _______________________________________________________   I appreciate the  opportunity to care for you  Thank You   Harl Bowie , MD

## 2022-02-25 ENCOUNTER — Telehealth: Payer: Self-pay | Admitting: *Deleted

## 2022-02-25 NOTE — Telephone Encounter (Signed)
Dr Silverio Decamp can you sign your office note from 2/1

## 2022-03-13 ENCOUNTER — Encounter: Payer: Self-pay | Admitting: Gastroenterology

## 2022-03-13 NOTE — Telephone Encounter (Signed)
Faxed last office note and imaging to [facility] Dr Barry Dienes for consult for Cholecystectomy today

## 2022-03-18 ENCOUNTER — Encounter: Payer: Self-pay | Admitting: Family Medicine

## 2022-03-18 ENCOUNTER — Ambulatory Visit: Payer: Commercial Managed Care - PPO | Admitting: Family Medicine

## 2022-03-18 VITALS — BP 110/70 | HR 70 | Temp 98.6°F | Ht 67.0 in | Wt 214.6 lb

## 2022-03-18 DIAGNOSIS — Z0000 Encounter for general adult medical examination without abnormal findings: Secondary | ICD-10-CM | POA: Diagnosis not present

## 2022-03-18 DIAGNOSIS — J309 Allergic rhinitis, unspecified: Secondary | ICD-10-CM

## 2022-03-18 DIAGNOSIS — E890 Postprocedural hypothyroidism: Secondary | ICD-10-CM

## 2022-03-18 DIAGNOSIS — K9041 Non-celiac gluten sensitivity: Secondary | ICD-10-CM | POA: Diagnosis not present

## 2022-03-18 DIAGNOSIS — K8020 Calculus of gallbladder without cholecystitis without obstruction: Secondary | ICD-10-CM | POA: Diagnosis not present

## 2022-03-18 DIAGNOSIS — K5000 Crohn's disease of small intestine without complications: Secondary | ICD-10-CM | POA: Diagnosis not present

## 2022-03-18 DIAGNOSIS — K219 Gastro-esophageal reflux disease without esophagitis: Secondary | ICD-10-CM | POA: Diagnosis not present

## 2022-03-18 HISTORY — DX: Calculus of gallbladder without cholecystitis without obstruction: K80.20

## 2022-03-18 LAB — COMPREHENSIVE METABOLIC PANEL
ALT: 18 U/L (ref 0–35)
AST: 19 U/L (ref 0–37)
Albumin: 3.9 g/dL (ref 3.5–5.2)
Alkaline Phosphatase: 54 U/L (ref 39–117)
BUN: 14 mg/dL (ref 6–23)
CO2: 24 mEq/L (ref 19–32)
Calcium: 9.5 mg/dL (ref 8.4–10.5)
Chloride: 105 mEq/L (ref 96–112)
Creatinine, Ser: 0.7 mg/dL (ref 0.40–1.20)
GFR: 105.31 mL/min (ref >60.00)
Glucose, Bld: 90 mg/dL (ref 70–99)
Potassium: 4.2 mEq/L (ref 3.5–5.1)
Sodium: 137 mEq/L (ref 135–145)
Total Bilirubin: 0.8 mg/dL (ref 0.2–1.2)
Total Protein: 6.7 g/dL (ref 6.0–8.3)

## 2022-03-18 LAB — LIPID PANEL
Cholesterol: 169 mg/dL (ref 0–200)
HDL: 60.7 mg/dL (ref >39.00)
LDL Cholesterol: 99 mg/dL (ref 0–99)
NonHDL: 108.61
Total CHOL/HDL Ratio: 3
Triglycerides: 49 mg/dL (ref 0.0–149.0)
VLDL: 9.8 mg/dL (ref 0.0–40.0)

## 2022-03-18 LAB — CBC WITH DIFFERENTIAL/PLATELET
Basophils Absolute: 0 10*3/uL (ref 0.0–0.1)
Basophils Relative: 0.4 % (ref 0.0–3.0)
Eosinophils Absolute: 0.1 10*3/uL (ref 0.0–0.7)
Eosinophils Relative: 1.7 % (ref 0.0–5.0)
HCT: 42.8 % (ref 36.0–46.0)
Hemoglobin: 14.4 g/dL (ref 12.0–15.0)
Lymphocytes Relative: 24.9 % (ref 12.0–46.0)
Lymphs Abs: 1.6 10*3/uL (ref 0.7–4.0)
MCHC: 33.7 g/dL (ref 30.0–36.0)
MCV: 93.6 fl (ref 78.0–100.0)
Monocytes Absolute: 0.5 10*3/uL (ref 0.1–1.0)
Monocytes Relative: 8 % (ref 3.0–12.0)
Neutro Abs: 4.3 10*3/uL (ref 1.4–7.7)
Neutrophils Relative %: 65 % (ref 43.0–77.0)
Platelets: 251 10*3/uL (ref 150.0–400.0)
RBC: 4.57 Mil/uL (ref 3.87–5.11)
RDW: 13.6 % (ref 11.5–15.5)
WBC: 6.6 10*3/uL (ref 4.0–10.5)

## 2022-03-18 LAB — TSH: TSH: 0.29 u[IU]/mL — ABNORMAL LOW (ref 0.35–5.50)

## 2022-03-18 NOTE — Patient Instructions (Signed)
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
## 2022-03-19 LAB — T3: T3, Total: 114 ng/dL (ref 76–181)

## 2022-03-19 NOTE — Addendum Note (Signed)
Addended by: Billey Chang on: 03/19/2022 09:08 AM   Modules accepted: Orders

## 2022-04-20 ENCOUNTER — Other Ambulatory Visit (HOSPITAL_COMMUNITY): Payer: Self-pay

## 2022-06-17 ENCOUNTER — Other Ambulatory Visit (HOSPITAL_COMMUNITY): Payer: Self-pay

## 2022-06-20 ENCOUNTER — Other Ambulatory Visit: Payer: Self-pay | Admitting: General Surgery

## 2022-06-20 DIAGNOSIS — K8020 Calculus of gallbladder without cholecystitis without obstruction: Secondary | ICD-10-CM

## 2022-06-21 DIAGNOSIS — K8020 Calculus of gallbladder without cholecystitis without obstruction: Secondary | ICD-10-CM | POA: Diagnosis not present

## 2022-08-12 ENCOUNTER — Other Ambulatory Visit: Payer: Self-pay | Admitting: Gastroenterology

## 2022-08-12 ENCOUNTER — Other Ambulatory Visit (HOSPITAL_COMMUNITY): Payer: Self-pay

## 2022-08-12 MED ORDER — PANTOPRAZOLE SODIUM 20 MG PO TBEC
40.0000 mg | DELAYED_RELEASE_TABLET | Freq: Two times a day (BID) | ORAL | 2 refills | Status: DC
  Filled 2022-08-12: qty 60, 15d supply, fill #0
  Filled 2022-09-06: qty 60, 15d supply, fill #1
  Filled 2022-12-16: qty 60, 15d supply, fill #2

## 2022-08-15 ENCOUNTER — Other Ambulatory Visit (HOSPITAL_COMMUNITY): Payer: Self-pay

## 2022-08-22 ENCOUNTER — Other Ambulatory Visit (HOSPITAL_COMMUNITY): Payer: Self-pay

## 2022-08-22 MED ORDER — CHLORHEXIDINE GLUCONATE 0.12 % MT SOLN
OROMUCOSAL | 0 refills | Status: AC
  Filled 2022-08-22: qty 473, 20d supply, fill #0

## 2022-09-02 DIAGNOSIS — M1711 Unilateral primary osteoarthritis, right knee: Secondary | ICD-10-CM | POA: Diagnosis not present

## 2022-09-02 DIAGNOSIS — M170 Bilateral primary osteoarthritis of knee: Secondary | ICD-10-CM | POA: Diagnosis not present

## 2022-09-02 DIAGNOSIS — M1712 Unilateral primary osteoarthritis, left knee: Secondary | ICD-10-CM | POA: Diagnosis not present

## 2022-09-06 ENCOUNTER — Other Ambulatory Visit: Payer: Self-pay | Admitting: Family Medicine

## 2022-09-07 MED ORDER — LEVOTHYROXINE SODIUM 175 MCG PO TABS
175.0000 ug | ORAL_TABLET | Freq: Every day | ORAL | 3 refills | Status: DC
  Filled 2022-09-07: qty 90, 90d supply, fill #0
  Filled 2022-12-16: qty 90, 90d supply, fill #1

## 2022-09-08 ENCOUNTER — Other Ambulatory Visit (HOSPITAL_COMMUNITY): Payer: Self-pay

## 2022-09-09 ENCOUNTER — Other Ambulatory Visit (HOSPITAL_COMMUNITY): Payer: Self-pay

## 2022-09-11 ENCOUNTER — Other Ambulatory Visit (HOSPITAL_COMMUNITY): Payer: Self-pay

## 2022-10-09 ENCOUNTER — Encounter: Payer: Self-pay | Admitting: Family Medicine

## 2022-10-30 DIAGNOSIS — M25562 Pain in left knee: Secondary | ICD-10-CM | POA: Diagnosis not present

## 2022-10-30 DIAGNOSIS — M25561 Pain in right knee: Secondary | ICD-10-CM | POA: Diagnosis not present

## 2022-11-06 NOTE — Pre-Procedure Instructions (Signed)
Surgical Instructions   Your procedure is scheduled on Thursday, October 31st. Report to [facility] Main Entrance "A" at 11:00 A.M., then check in with the Admitting office. Any questions or running late day of surgery: call [phone]  Questions prior to your surgery date: call [phone], Monday-Friday, 8am-4pm. If you experience any cold or flu symptoms such as cough, fever, chills, shortness of breath, etc. between now and your scheduled surgery, please notify us at the above number.     Remember:  Do not eat after midnight the night before your surgery   You may drink clear liquids until 10:00 AM the morning of your surgery.   Clear liquids allowed are: Water, Non-Citrus Juices (without pulp), Carbonated Beverages, Clear Tea, Black Coffee Only (NO MILK, CREAM OR POWDERED CREAMER of any kind), and Gatorade.    Take these medicines the morning of surgery with A SIP OF WATER  levothyroxine (SYNTHROID)  pantoprazole (PROTONIX)     One week prior to surgery, STOP taking any Aspirin (unless otherwise instructed by your surgeon) Aleve, Naproxen, Ibuprofen, Motrin, Advil, Goody's, BC's, all herbal medications, fish oil, and non-prescription vitamins.                     Do NOT Smoke (Tobacco/Vaping) for 24 hours prior to your procedure.  If you use a CPAP at night, you may bring your mask/headgear for your overnight stay.   You will be asked to remove any contacts, glasses, piercing's, hearing aid's, dentures/partials prior to surgery. Please bring cases for these items if needed.    Patients discharged the day of surgery will not be allowed to drive home, and someone needs to stay with them for 24 hours.  SURGICAL WAITING ROOM VISITATION Patients may have no more than 2 support people in the waiting area - these visitors may rotate.   Pre-op nurse will coordinate an appropriate time for 1 ADULT support person, who may not rotate, to accompany patient in pre-op.  Children  under the age of 72 must have an adult with them who is not the patient and must remain in the main waiting area with an adult.  If the patient needs to stay at the hospital during part of their recovery, the visitor guidelines for inpatient rooms apply.  Please refer to the [facility] website for the visitor guidelines for any additional information.   If you received a COVID test during your pre-op visit  it is requested that you wear a mask when out in public, stay away from anyone that may not be feeling well and notify your surgeon if you develop symptoms. If you have been in contact with anyone that has tested positive in the last 10 days please notify you surgeon.      Pre-operative CHG Bathing Instructions   You can play a key role in reducing the risk of infection after surgery. Your skin needs to be as free of germs as possible. You can reduce the number of germs on your skin by washing with CHG (chlorhexidine gluconate) soap before surgery. CHG is an antiseptic soap that kills germs and continues to kill germs even after washing.   DO NOT use if you have an allergy to chlorhexidine/CHG or antibacterial soaps. If your skin becomes reddened or irritated, stop using the CHG and notify one of our RNs at [phone].              TAKE A SHOWER THE NIGHT BEFORE SURGERY AND THE  DAY OF SURGERY    Please keep in mind the following:  DO NOT shave, including legs and underarms, 48 hours prior to surgery.   You may shave your face before/day of surgery.  Place clean sheets on your bed the night before surgery Use a clean washcloth (not used since being washed) for each shower. DO NOT sleep with pet's night before surgery.  CHG Shower Instructions:  Wash your face and private area with normal soap. If you choose to wash your hair, wash first with your normal shampoo.  After you use shampoo/soap, rinse your hair and body thoroughly to remove shampoo/soap residue.  Turn the water OFF and  apply half the bottle of CHG soap to a CLEAN washcloth.  Apply CHG soap ONLY FROM YOUR NECK DOWN TO YOUR TOES (washing for 3-5 minutes)  DO NOT use CHG soap on face, private areas, open wounds, or sores.  Pay special attention to the area where your surgery is being performed.  If you are having back surgery, having someone wash your back for you may be helpful. Wait 2 minutes after CHG soap is applied, then you may rinse off the CHG soap.  Pat dry with a clean towel  Put on clean pajamas    Additional instructions for the day of surgery: DO NOT APPLY any lotions, deodorants, cologne, or perfumes.   Do not wear jewelry or makeup Do not wear nail polish, gel polish, artificial nails, or any other type of covering on natural nails (fingers and toes) Do not bring valuables to the hospital. [facility] is not responsible for valuables/personal belongings. Put on clean/comfortable clothes.  Please brush your teeth.  Ask your nurse before applying any prescription medications to the skin.

## 2022-11-07 ENCOUNTER — Encounter (HOSPITAL_COMMUNITY): Payer: Self-pay

## 2022-11-07 ENCOUNTER — Encounter (HOSPITAL_COMMUNITY)
Admission: RE | Admit: 2022-11-07 | Discharge: 2022-11-07 | Disposition: A | Discharge status: Home / Self Care | Payer: Commercial Managed Care - PPO | Source: Ambulatory Visit | Attending: General Surgery | Admitting: General Surgery

## 2022-11-07 ENCOUNTER — Other Ambulatory Visit: Payer: Self-pay

## 2022-11-07 VITALS — BP 121/84 | HR 69 | Temp 98.3°F | Resp 18 | Ht 67.0 in | Wt 189.6 lb

## 2022-11-07 DIAGNOSIS — Z01812 Encounter for preprocedural laboratory examination: Secondary | ICD-10-CM | POA: Diagnosis not present

## 2022-11-07 DIAGNOSIS — Z01818 Encounter for other preprocedural examination: Secondary | ICD-10-CM | POA: Diagnosis present

## 2022-11-07 LAB — CBC
HCT: 44.6 % (ref 36.0–46.0)
Hemoglobin: 15 g/dL (ref 12.0–15.0)
MCH: 31.3 pg (ref 26.0–34.0)
MCHC: 33.6 g/dL (ref 30.0–36.0)
MCV: 92.9 fL (ref 80.0–100.0)
Platelets: 231 10*3/uL (ref 150–400)
RBC: 4.8 MIL/uL (ref 3.87–5.11)
RDW: 12.8 % (ref 11.5–15.5)
WBC: 6.9 10*3/uL (ref 4.0–10.5)
nRBC: 0 % (ref 0.0–0.2)

## 2022-11-07 NOTE — Progress Notes (Signed)
PCP - Asencion Partridge, MD Cardiologist - denies  PPM/ICD - n/a  Chest x-ray - n/a EKG - n/a Stress Test - denies ECHO - denies Cardiac Cath - denies  Sleep Study - denies CPAP - denies  Blood Thinner Instructions: n/a Aspirin Instructions: n/a  ERAS Protcol -Clear liquids until 1000 DOS PRE-SURGERY Ensure or G2- none ordered.  COVID TEST- n/a  Anesthesia review: No  Patient denies shortness of breath, fever, cough and chest pain at PAT appointment   All instructions explained to the patient, with a verbal understanding of the material. Patient agrees to go over the instructions while at home for a better understanding. Patient also instructed to self quarantine after being tested for COVID-19. The opportunity to ask questions was provided.

## 2022-11-08 ENCOUNTER — Other Ambulatory Visit: Payer: Self-pay | Admitting: General Surgery

## 2022-11-13 NOTE — Progress Notes (Signed)
Pt made aware of surgery time for 10/31 1330-1500, arrival 1130. Stop drinking clear liquids by 1030, and to follow all other previous instructions.

## 2022-11-14 ENCOUNTER — Encounter (HOSPITAL_COMMUNITY)
Admission: RE | Disposition: A | Discharge status: Home / Self Care | Payer: Self-pay | Source: Home / Self Care | Attending: General Surgery

## 2022-11-14 ENCOUNTER — Ambulatory Visit (HOSPITAL_COMMUNITY)
Admission: RE | Admit: 2022-11-14 | Discharge: 2022-11-14 | Disposition: A | Discharge status: Home / Self Care | Payer: Commercial Managed Care - PPO | Attending: General Surgery | Admitting: General Surgery

## 2022-11-14 ENCOUNTER — Other Ambulatory Visit: Payer: Self-pay

## 2022-11-14 ENCOUNTER — Encounter (HOSPITAL_COMMUNITY): Payer: Self-pay | Admitting: General Surgery

## 2022-11-14 ENCOUNTER — Ambulatory Visit (HOSPITAL_COMMUNITY): Payer: Commercial Managed Care - PPO | Admitting: Anesthesiology

## 2022-11-14 ENCOUNTER — Ambulatory Visit (HOSPITAL_BASED_OUTPATIENT_CLINIC_OR_DEPARTMENT_OTHER): Payer: Commercial Managed Care - PPO | Admitting: Anesthesiology

## 2022-11-14 ENCOUNTER — Other Ambulatory Visit (HOSPITAL_COMMUNITY): Payer: Self-pay

## 2022-11-14 DIAGNOSIS — Z87891 Personal history of nicotine dependence: Secondary | ICD-10-CM | POA: Insufficient documentation

## 2022-11-14 DIAGNOSIS — E039 Hypothyroidism, unspecified: Secondary | ICD-10-CM | POA: Diagnosis not present

## 2022-11-14 DIAGNOSIS — Z79890 Hormone replacement therapy (postmenopausal): Secondary | ICD-10-CM | POA: Insufficient documentation

## 2022-11-14 DIAGNOSIS — K219 Gastro-esophageal reflux disease without esophagitis: Secondary | ICD-10-CM | POA: Insufficient documentation

## 2022-11-14 DIAGNOSIS — K8010 Calculus of gallbladder with chronic cholecystitis without obstruction: Secondary | ICD-10-CM | POA: Insufficient documentation

## 2022-11-14 DIAGNOSIS — Z79899 Other long term (current) drug therapy: Secondary | ICD-10-CM | POA: Insufficient documentation

## 2022-11-14 DIAGNOSIS — K5090 Crohn's disease, unspecified, without complications: Secondary | ICD-10-CM | POA: Diagnosis not present

## 2022-11-14 DIAGNOSIS — Z01818 Encounter for other preprocedural examination: Secondary | ICD-10-CM

## 2022-11-14 DIAGNOSIS — K8020 Calculus of gallbladder without cholecystitis without obstruction: Secondary | ICD-10-CM | POA: Diagnosis not present

## 2022-11-14 HISTORY — PX: CHOLECYSTECTOMY: SHX55

## 2022-11-14 LAB — POCT PREGNANCY, URINE: Preg Test, Ur: NEGATIVE

## 2022-11-14 SURGERY — LAPAROSCOPIC CHOLECYSTECTOMY
Anesthesia: General | Site: Abdomen

## 2022-11-14 MED ORDER — KETOROLAC TROMETHAMINE 15 MG/ML IJ SOLN
INTRAMUSCULAR | Status: DC | PRN
  Administered 2022-11-14: 30 mg via INTRAVENOUS

## 2022-11-14 MED ORDER — ONDANSETRON HCL 4 MG/2ML IJ SOLN: 4.0000 mg | Freq: Once | INTRAMUSCULAR | Status: DC | PRN

## 2022-11-14 MED ORDER — ORAL CARE MOUTH RINSE
15.0000 mL | Freq: Once | OROMUCOSAL | Status: DC
Start: 2022-11-14 — End: 2022-11-14

## 2022-11-14 MED ORDER — INDOCYANINE GREEN 25 MG IV SOLR
25.0000 mg | Freq: Once | INTRAVENOUS | Status: AC
  Administered 2022-11-14: 7.5 mg via TOPICAL
  Filled 2022-11-14: qty 10

## 2022-11-14 MED ORDER — OXYCODONE HCL 5 MG PO TABS
ORAL_TABLET | ORAL | Status: AC
  Filled 2022-11-14: qty 1

## 2022-11-14 MED ORDER — PROPOFOL 10 MG/ML IV BOLUS
INTRAVENOUS | Status: DC | PRN
  Administered 2022-11-14: 150 mg via INTRAVENOUS

## 2022-11-14 MED ORDER — CEFAZOLIN SODIUM-DEXTROSE 2-4 GM/100ML-% IV SOLN
INTRAVENOUS | Status: AC
  Filled 2022-11-14: qty 100

## 2022-11-14 MED ORDER — SUGAMMADEX SODIUM 200 MG/2ML IV SOLN
INTRAVENOUS | Status: DC | PRN
  Administered 2022-11-14: 200 mg via INTRAVENOUS

## 2022-11-14 MED ORDER — MIDAZOLAM HCL 2 MG/2ML IJ SOLN
INTRAMUSCULAR | Status: DC | PRN
  Administered 2022-11-14: 2 mg via INTRAVENOUS

## 2022-11-14 MED ORDER — KETAMINE HCL 50 MG/5ML IJ SOSY
PREFILLED_SYRINGE | INTRAMUSCULAR | Status: AC
  Filled 2022-11-14: qty 5

## 2022-11-14 MED ORDER — LIDOCAINE 2% (20 MG/ML) 5 ML SYRINGE
INTRAMUSCULAR | Status: DC | PRN
  Administered 2022-11-14: 100 mg via INTRAVENOUS

## 2022-11-14 MED ORDER — CHLORHEXIDINE GLUCONATE 0.12 % MT SOLN
15.0000 mL | Freq: Once | OROMUCOSAL | Status: DC
Start: 2022-11-14 — End: 2022-11-14

## 2022-11-14 MED ORDER — ONDANSETRON HCL 4 MG/2ML IJ SOLN
INTRAMUSCULAR | Status: DC | PRN
  Administered 2022-11-14: 4 mg via INTRAVENOUS

## 2022-11-14 MED ORDER — ACETAMINOPHEN 500 MG PO TABS: 1000.0000 mg | ORAL_TABLET | ORAL | Status: AC

## 2022-11-14 MED ORDER — CHLORHEXIDINE GLUCONATE CLOTH 2 % EX PADS: 6.0000 | MEDICATED_PAD | Freq: Once | CUTANEOUS | Status: DC

## 2022-11-14 MED ORDER — ACETAMINOPHEN 160 MG/5ML PO SOLN: 325.0000 mg | ORAL | Status: DC | PRN

## 2022-11-14 MED ORDER — LIDOCAINE HCL 1 % IJ SOLN
INTRAMUSCULAR | Status: AC
  Filled 2022-11-14: qty 20

## 2022-11-14 MED ORDER — LIDOCAINE HCL 1 % IJ SOLN
INTRAMUSCULAR | Status: DC | PRN
  Administered 2022-11-14: 10 mL via INTRAMUSCULAR

## 2022-11-14 MED ORDER — OXYCODONE HCL 5 MG PO TABS
5.0000 mg | ORAL_TABLET | Freq: Once | ORAL | Status: AC | PRN
  Administered 2022-11-14: 5 mg via ORAL

## 2022-11-14 MED ORDER — ROCURONIUM BROMIDE 10 MG/ML (PF) SYRINGE
PREFILLED_SYRINGE | INTRAVENOUS | Status: DC | PRN
  Administered 2022-11-14: 60 mg via INTRAVENOUS

## 2022-11-14 MED ORDER — PROPOFOL 10 MG/ML IV BOLUS
INTRAVENOUS | Status: AC
  Filled 2022-11-14: qty 20

## 2022-11-14 MED ORDER — DEXMEDETOMIDINE HCL IN NACL 80 MCG/20ML IV SOLN
INTRAVENOUS | Status: DC | PRN
  Administered 2022-11-14 (×2): 8 ug via INTRAVENOUS

## 2022-11-14 MED ORDER — KETOROLAC TROMETHAMINE 30 MG/ML IJ SOLN
INTRAMUSCULAR | Status: AC
  Filled 2022-11-14: qty 1

## 2022-11-14 MED ORDER — OXYCODONE HCL 5 MG/5ML PO SOLN: 5.0000 mg | Freq: Once | ORAL | Status: AC | PRN

## 2022-11-14 MED ORDER — LACTATED RINGERS IV SOLN: INTRAVENOUS | Status: DC

## 2022-11-14 MED ORDER — KETAMINE HCL 10 MG/ML IJ SOLN
INTRAMUSCULAR | Status: DC | PRN
  Administered 2022-11-14: 30 mg via INTRAVENOUS

## 2022-11-14 MED ORDER — BUPIVACAINE-EPINEPHRINE (PF) 0.25% -1:200000 IJ SOLN
INTRAMUSCULAR | Status: AC
  Filled 2022-11-14: qty 30

## 2022-11-14 MED ORDER — FENTANYL CITRATE (PF) 100 MCG/2ML IJ SOLN: 25.0000 ug | INTRAMUSCULAR | Status: DC | PRN

## 2022-11-14 MED ORDER — FENTANYL CITRATE (PF) 250 MCG/5ML IJ SOLN
INTRAMUSCULAR | Status: DC | PRN
  Administered 2022-11-14: 50 ug via INTRAVENOUS
  Administered 2022-11-14: 100 ug via INTRAVENOUS

## 2022-11-14 MED ORDER — ACETAMINOPHEN 500 MG PO TABS
ORAL_TABLET | ORAL | Status: AC
  Administered 2022-11-14: 1000 mg via ORAL
  Filled 2022-11-14: qty 2

## 2022-11-14 MED ORDER — DEXAMETHASONE SODIUM PHOSPHATE 10 MG/ML IJ SOLN
INTRAMUSCULAR | Status: AC
  Filled 2022-11-14: qty 1

## 2022-11-14 MED ORDER — FENTANYL CITRATE (PF) 250 MCG/5ML IJ SOLN
INTRAMUSCULAR | Status: AC
  Filled 2022-11-14: qty 5

## 2022-11-14 MED ORDER — CHLORHEXIDINE GLUCONATE 0.12 % MT SOLN: 15.0000 mL | Freq: Once | OROMUCOSAL | Status: AC

## 2022-11-14 MED ORDER — DEXAMETHASONE SODIUM PHOSPHATE 10 MG/ML IJ SOLN
INTRAMUSCULAR | Status: DC | PRN
  Administered 2022-11-14: 8 mg via INTRAVENOUS

## 2022-11-14 MED ORDER — ORAL CARE MOUTH RINSE: 15.0000 mL | Freq: Once | OROMUCOSAL | Status: AC

## 2022-11-14 MED ORDER — LIDOCAINE 2% (20 MG/ML) 5 ML SYRINGE
INTRAMUSCULAR | Status: AC
  Filled 2022-11-14: qty 5

## 2022-11-14 MED ORDER — ACETAMINOPHEN 325 MG PO TABS: 325.0000 mg | ORAL_TABLET | ORAL | Status: DC | PRN

## 2022-11-14 MED ORDER — ROCURONIUM BROMIDE 10 MG/ML (PF) SYRINGE
PREFILLED_SYRINGE | INTRAVENOUS | Status: AC
  Filled 2022-11-14: qty 10

## 2022-11-14 MED ORDER — SUCCINYLCHOLINE CHLORIDE 200 MG/10ML IV SOSY
PREFILLED_SYRINGE | INTRAVENOUS | Status: AC
  Filled 2022-11-14: qty 10

## 2022-11-14 MED ORDER — ONDANSETRON HCL 4 MG/2ML IJ SOLN
INTRAMUSCULAR | Status: AC
  Filled 2022-11-14: qty 2

## 2022-11-14 MED ORDER — 0.9 % SODIUM CHLORIDE (POUR BTL) OPTIME
TOPICAL | Status: DC | PRN
  Administered 2022-11-14: 1000 mL

## 2022-11-14 MED ORDER — OXYCODONE HCL 5 MG PO TABS
5.0000 mg | ORAL_TABLET | Freq: Four times a day (QID) | ORAL | 0 refills | Status: DC | PRN
  Filled 2022-11-14: qty 10, 3d supply, fill #0

## 2022-11-14 MED ORDER — CHLORHEXIDINE GLUCONATE 0.12 % MT SOLN
OROMUCOSAL | Status: AC
  Administered 2022-11-14: 15 mL via OROMUCOSAL
  Filled 2022-11-14: qty 15

## 2022-11-14 MED ORDER — CEFAZOLIN SODIUM-DEXTROSE 2-4 GM/100ML-% IV SOLN
2.0000 g | INTRAVENOUS | Status: AC
  Administered 2022-11-14: 2 g via INTRAVENOUS

## 2022-11-14 MED ORDER — MEPERIDINE HCL 25 MG/ML IJ SOLN: 6.2500 mg | INTRAMUSCULAR | Status: DC | PRN

## 2022-11-14 MED ORDER — MIDAZOLAM HCL 2 MG/2ML IJ SOLN
INTRAMUSCULAR | Status: AC
  Filled 2022-11-14: qty 2

## 2022-11-14 SURGICAL SUPPLY — 48 items
ADH SKN CLS APL DERMABOND .7 (GAUZE/BANDAGES/DRESSINGS) ×1
APL PRP STRL LF DISP 70% ISPRP (MISCELLANEOUS) ×1
APPLIER CLIP ROT 10 11.4 M/L (STAPLE) ×1
APR CLP MED LRG 11.4X10 (STAPLE) ×1
BAG COUNTER SPONGE SURGICOUNT (BAG) ×1 IMPLANT
BAG SPNG CNTER NS LX DISP (BAG)
BLADE CLIPPER SURG (BLADE) IMPLANT
CANISTER SUCT 3000ML PPV (MISCELLANEOUS) ×1 IMPLANT
CHLORAPREP W/TINT 26 (MISCELLANEOUS) ×1 IMPLANT
CLIP APPLIE ROT 10 11.4 M/L (STAPLE) ×1 IMPLANT
COVER SURGICAL LIGHT HANDLE (MISCELLANEOUS) ×1 IMPLANT
DERMABOND ADVANCED .7 DNX12 (GAUZE/BANDAGES/DRESSINGS) ×1 IMPLANT
ELECT REM PT RETURN 9FT ADLT (ELECTROSURGICAL) ×1
ELECTRODE REM PT RTRN 9FT ADLT (ELECTROSURGICAL) ×1 IMPLANT
GLOVE BIO SURGEON STRL SZ 6 (GLOVE) ×1 IMPLANT
GLOVE INDICATOR 6.5 STRL GRN (GLOVE) ×1 IMPLANT
GOWN STRL REUS W/ TWL LRG LVL3 (GOWN DISPOSABLE) ×2 IMPLANT
GOWN STRL REUS W/ TWL XL LVL3 (GOWN DISPOSABLE) ×1 IMPLANT
GOWN STRL REUS W/TWL LRG LVL3 (GOWN DISPOSABLE) ×2
GOWN STRL REUS W/TWL XL LVL3 (GOWN DISPOSABLE) ×1
IRRIG SUCT STRYKERFLOW 2 WTIP (MISCELLANEOUS) ×1
IRRIGATION SUCT STRKRFLW 2 WTP (MISCELLANEOUS) ×1 IMPLANT
KIT BASIN OR (CUSTOM PROCEDURE TRAY) ×1 IMPLANT
KIT IMAGING PINPOINTPAQ (MISCELLANEOUS) IMPLANT
KIT TURNOVER KIT B (KITS) ×1 IMPLANT
L-HOOK LAP DISP 36CM (ELECTROSURGICAL) ×1
LHOOK LAP DISP 36CM (ELECTROSURGICAL) ×1 IMPLANT
NS IRRIG 1000ML POUR BTL (IV SOLUTION) ×1 IMPLANT
PAD ARMBOARD 7.5X6 YLW CONV (MISCELLANEOUS) ×1 IMPLANT
PENCIL BUTTON HOLSTER BLD 10FT (ELECTRODE) ×1 IMPLANT
SCISSORS LAP 5X35 DISP (ENDOMECHANICALS) ×1 IMPLANT
SET TUBE SMOKE EVAC HIGH FLOW (TUBING) ×1 IMPLANT
SLEEVE Z-THREAD 5X100MM (TROCAR) ×1 IMPLANT
SPECIMEN JAR SMALL (MISCELLANEOUS) ×1 IMPLANT
SPIKE FLUID TRANSFER (MISCELLANEOUS) ×1 IMPLANT
STRIP CLOSURE SKIN 1/2X4 (GAUZE/BANDAGES/DRESSINGS) IMPLANT
SUT MNCRL AB 4-0 PS2 18 (SUTURE) ×1 IMPLANT
SUT VICRYL 0 AB UR-6 (SUTURE) IMPLANT
SYS BAG RETRIEVAL 10MM (BASKET) ×1
SYSTEM BAG RETRIEVAL 10MM (BASKET) ×1 IMPLANT
TOWEL GREEN STERILE (TOWEL DISPOSABLE) ×1 IMPLANT
TOWEL GREEN STERILE FF (TOWEL DISPOSABLE) ×1 IMPLANT
TRAY LAPAROSCOPIC MC (CUSTOM PROCEDURE TRAY) ×1 IMPLANT
TROCAR 11X100 Z THREAD (TROCAR) ×1 IMPLANT
TROCAR BALLN 12MMX100 BLUNT (TROCAR) ×1 IMPLANT
TROCAR Z-THREAD OPTICAL 5X100M (TROCAR) ×1 IMPLANT
WARMER LAPAROSCOPE (MISCELLANEOUS) ×1 IMPLANT
WATER STERILE IRR 1000ML POUR (IV SOLUTION) ×1 IMPLANT

## 2022-11-14 NOTE — Transfer of Care (Signed)
Immediate Anesthesia Transfer of Care Note  Patient: Sophia Blackwell  Procedure(s) Performed: LAPAROSCOPIC CHOLECYSTECTOMY (Abdomen)  Patient Location: PACU  Anesthesia Type:General  Level of Consciousness: awake and sedated  Airway & Oxygen Therapy: Patient Spontanous Breathing and Patient connected to nasal cannula oxygen  Post-op Assessment: Report given to RN and Post -op Vital signs reviewed and stable  Post vital signs: Reviewed and stable  Last Vitals:  Vitals Value Taken Time  BP 124/72 11/14/22 1515  Temp 36.4 C 11/14/22 1511  Pulse 73 11/14/22 1516  Resp 11 11/14/22 1516  SpO2 100 % 11/14/22 1516  Vitals shown include unfiled device data.  Last Pain:  Vitals:   11/14/22 1511  TempSrc:   PainSc: 0-No pain         Complications: No notable events documented.

## 2022-11-14 NOTE — Op Note (Signed)
Laparoscopic Cholecystectomy ICG cholangiography  Indications: This patient presents with symptomatic cholelithiasis and will undergo laparoscopic cholecystectomy.  Pre-operative Diagnosis: symptomatic cholelithiasis  Post-operative Diagnosis: Same with chronic cholecystitis  Surgeon: Almond Lint   Assistants: Jeronimo Greaves, RNFA  Anesthesia: General endotracheal anesthesia and local  ASA Class: 3  Procedure Details   The patient was seen again in the Holding Room. The risks, benefits, complications, treatment options, and expected outcomes were discussed with the patient. The possibilities of  bleeding, recurrent infection, damage to nearby structures, the need for additional procedures, failure to diagnose a condition, the possible need to convert to an open procedure, and creating a complication requiring transfusion or operation were discussed with the patient. The likelihood of improving the patient's symptoms with return to their baseline status is good.    The patient and/or family concurred with the proposed plan, giving informed consent. The site of surgery properly noted. The patient was taken to OR # 8 and placed supine on the OR table.  SCDs were placed. Antibiotic prophylaxis was administered. General endotracheal anesthesia was then administered and tolerated well. After the induction, the abdomen was prepped with Chloraprep and draped in the sterile fashion. and the procedure verified as Laparoscopic Cholecystectomy with possible Intraoperative Cholangiogram. A Time Out was held and the above information confirmed.  Local anesthetic agent was injected into the skin near the umbilicus and a 1.5 cm vertical incision was made with a #11 blade. I dissected down to the abdominal fascia with blunt dissection.  The fascia was incised vertically and the peritoneal cavity was entered bluntly.  A pursestring suture of 0-Vicryl was placed around the fascial opening.  The Hasson cannula  was inserted and secured with the stay suture.  Pneumoperitoneum was then created with CO2 and tolerated well without any adverse changes in the patient's vital signs. An 11-mm port was placed in the subxiphoid position.  Two 5-mm ports were placed in the right upper quadrant. All skin incisions were infiltrated with a local anesthetic agent before making the incision and placing the trocars.   The patient was placed into reverse Trendelenburg position and was tilted slightly to the patient's left.  The gallbladder was identified, and there were omental  adhesions noted.  Adhesions were lysed bluntly and with the electrocautery where indicated, taking care not to injure any adjacent organs or viscus. The fundus of the gallbladder was then grasped and retracted cephalad.  The infundibulum was grasped and retracted laterally, exposing the peritoneum overlying the triangle of Calot. The cystic duct and cystic artery were identified.  These were both skeletonized.  A window between the gallbladder and the liver was obtained to ensure a critical view.    The ICG cholangiography was then performed, and the cystic duct and common bile duct were clearly visualized.  The cystic duct was then clipped once on the specimen side and thrice on the patient side.  The cystic artery was clipped once on the patient side and doubly clipped on the patient side.    The gallbladder was dissected from the liver bed in retrograde fashion with the electrocautery. There was no spillage of stones or bile.  The gallbladder was removed and placed in a retrieval bag.  The gallbladder and bag were then removed through the umbilical port site.  The liver bed was inspected. Hemostasis was achieved with the electrocautery.   Pneumoperitoneum was released as we removed the trocars. The suction irrigator was used to aspirate all the air from above the  liver while the trocars were removed.  The pursestring suture was used to close the umbilical  fascia.  One additional 0-0 vicryl was needed.    4-0 Monocryl was used to close the skin in subcuticular fashion.   The skin was cleaned and dry, and Dermabond was applied. The patient was then extubated and brought to the recovery room in stable condition. Instrument, sponge, and needle counts were correct at closure and at the conclusion of the case.   Findings: Mild chronic inflammation with some omental adhesions. .    Estimated Blood Loss: min         Drains: none          Specimens: Gallbladder to pathology       Complications: None; patient tolerated the procedure well.         Disposition: PACU - hemodynamically stable.         Condition: stable

## 2022-11-14 NOTE — Anesthesia Preprocedure Evaluation (Addendum)
Anesthesia Evaluation  Patient identified by MRN, date of birth, ID band Patient awake    Reviewed: Allergy & Precautions, H&P , NPO status , Patient's Chart, lab work & pertinent test results  Airway Mallampati: II  TM Distance: >3 FB Neck ROM: Full    Dental no notable dental hx. (+) Teeth Intact, Caps, Dental Advisory Given   Pulmonary former smoker   Pulmonary exam normal breath sounds clear to auscultation       Cardiovascular Exercise Tolerance: Good negative cardio ROS Normal cardiovascular exam Rhythm:Regular Rate:Normal     Neuro/Psych negative neurological ROS  negative psych ROS   GI/Hepatic negative GI ROS, Neg liver ROS,GERD  ,,  Endo/Other  negative endocrine ROSHypothyroidism    Renal/GU negative Renal ROS  negative genitourinary   Musculoskeletal negative musculoskeletal ROS (+) Arthritis ,    Abdominal   Peds negative pediatric ROS (+)  Hematology negative hematology ROS (+)   Anesthesia Other Findings   Reproductive/Obstetrics negative OB ROS                             Anesthesia Physical Anesthesia Plan  ASA: 3  Anesthesia Plan: General   Post-op Pain Management: Toradol IV (intra-op)* and Ofirmev IV (intra-op)*   Induction: Intravenous  PONV Risk Score and Plan: 3 and Ondansetron and Dexamethasone  Airway Management Planned: Oral ETT  Additional Equipment: None  Intra-op Plan:   Post-operative Plan:   Informed Consent: I have reviewed the patients History and Physical, chart, labs and discussed the procedure including the risks, benefits and alternatives for the proposed anesthesia with the patient or authorized representative who has indicated his/her understanding and acceptance.       Plan Discussed with: Anesthesiologist and CRNA  Anesthesia Plan Comments:         Anesthesia Quick Evaluation

## 2022-11-14 NOTE — Anesthesia Procedure Notes (Addendum)
Procedure Name: Intubation Date/Time: 11/14/2022 2:12 PM  Performed by: Heron Sabins, CRNAPre-anesthesia Checklist: Patient identified, Emergency Drugs available, Suction available and Patient being monitored Patient Re-evaluated:Patient Re-evaluated prior to induction Oxygen Delivery Method: Circle System Utilized Preoxygenation: Pre-oxygenation with 100% oxygen Induction Type: IV induction Ventilation: Mask ventilation without difficulty Laryngoscope Size: Mac and 3 Grade View: Grade I Tube type: Oral Number of attempts: 1 Airway Equipment and Method: Stylet and Oral airway Placement Confirmation: ETT inserted through vocal cords under direct vision, positive ETCO2 and breath sounds checked- equal and bilateral Secured at: 22 cm Tube secured with: Tape Dental Injury: Teeth and Oropharynx as per pre-operative assessment

## 2022-11-14 NOTE — Anesthesia Postprocedure Evaluation (Signed)
Anesthesia Post Note  Patient: Sophia Blackwell  Procedure(s) Performed: LAPAROSCOPIC CHOLECYSTECTOMY (Abdomen)     Patient location during evaluation: PACU Anesthesia Type: General Level of consciousness: awake and alert and oriented Pain management: pain level controlled Vital Signs Assessment: post-procedure vital signs reviewed and stable Respiratory status: spontaneous breathing, nonlabored ventilation and respiratory function stable Cardiovascular status: blood pressure returned to baseline and stable Postop Assessment: no apparent nausea or vomiting Anesthetic complications: no   No notable events documented.  Last Vitals:  Vitals:   11/14/22 1530 11/14/22 1545  BP: 118/76 124/81  Pulse: 62 77  Resp: 12 18  Temp:    SpO2: 100% 100%    Last Pain:  Vitals:   11/14/22 1530  TempSrc:   PainSc: 0-No pain                 Coyle Stordahl A.

## 2022-11-14 NOTE — H&P (Signed)
Sophia Blackwell is an 47 y.o. female.   Chief Complaint: abdominal pain HPI:  Pt has been having GI issues for many years and was diagnosed with crohn's.  In the more recent past she started having pain and reflux.  Protonix didn't help.  She has started having episodes of more severe pain and nausea and workup showed gallstones.    Since I saw her last she has had some more chronic RUQ discomfort.    Past Medical History:  Diagnosis Date   Acquired hypothyroidism 08/28/2017   Allergy    Arthritis    Crohn disease (HCC)    GERD (gastroesophageal reflux disease)     Past Surgical History:  Procedure Laterality Date   ANKLE SURGERY Right    about 47 years of age.   THYROIDECTOMY  2015   benign nodules; done prophylactially due to strong FH of thyroid cancer in mom and GM   TUBAL LIGATION      Family History  Problem Relation Age of Onset   Rheum arthritis Mother    Diverticulitis Mother    Hypertension Mother    Obesity Mother    Thyroid cancer Mother    Arthritis Mother    Non-Hodgkin's lymphoma Father    Alcohol abuse Father    Rheum arthritis Brother    Obesity Brother    Anxiety disorder Brother    Asthma Brother    Depression Brother    Healthy Daughter    Healthy Son    Thyroid cancer Maternal Grandfather    Heart disease Maternal Grandfather    Healthy Daughter    Social History:  reports that she has quit smoking. She has never used smokeless tobacco. She reports current alcohol use. She reports that she does not use drugs.  Allergies:  Allergies  Allergen Reactions   Dilaudid [Hydromorphone Hcl] Anaphylaxis    Medications Prior to Admission  Medication Sig Dispense Refill   GLUCOSAMINE-CHONDROITIN PO Take 2 capsules by mouth daily.     levocetirizine (XYZAL) 5 MG tablet Take 5 mg by mouth every evening.     levothyroxine (SYNTHROID) 175 MCG tablet Take 1 tablet (175 mcg total) by mouth daily before breakfast. 90 tablet 3   Magnesium Oxide 420 MG TABS  Take 420 mg by mouth daily.     OVER THE COUNTER MEDICATION Take 2,400 mg by mouth daily. Beet Root     pantoprazole (PROTONIX) 20 MG tablet Take 2 tablets (40 mg total) by mouth 2 (two) times daily. (Patient taking differently: Take 20 mg by mouth daily.) 60 tablet 2   TURMERIC PO Take 1,200 mg by mouth daily.     chlorhexidine (PERIDEX) 0.12 % solution After brushing, rinse mouth with 15ml for 1 minute then spit out--use twice daily (Patient not taking: Reported on 10/25/2022) 473 mL 0   dicyclomine (BENTYL) 10 MG capsule Take 1 capsule (10 mg total) by mouth 4 (four) times daily as needed for spasms. (Patient not taking: Reported on 10/25/2022) 90 capsule 3   fluticasone (FLONASE) 50 MCG/ACT nasal spray Place 1 spray into both nostrils daily. (Patient not taking: Reported on 10/25/2022) 16 g 1   liothyronine (CYTOMEL) 25 MCG tablet Take 1 tablet (25 mcg total) by mouth daily on an empty stomach. (Patient not taking: Reported on 10/25/2022) 90 tablet 1    No results found for this or any previous visit (from the past 48 hour(s)). No results found.  Review of Systems  Gastrointestinal:  Positive for abdominal pain.  All other systems reviewed and are negative.   There were no vitals taken for this visit. Physical Exam Constitutional:      General: She is not in acute distress.    Appearance: She is not ill-appearing, toxic-appearing or diaphoretic.  HENT:     Head: Normocephalic and atraumatic.  Eyes:     Pupils: Pupils are equal, round, and reactive to light.  Cardiovascular:     Rate and Rhythm: Normal rate.     Pulses: Normal pulses.  Pulmonary:     Effort: Pulmonary effort is normal.  Abdominal:     General: Abdomen is flat.     Palpations: Abdomen is soft.  Musculoskeletal:        General: Normal range of motion.  Skin:    General: Skin is warm and dry.     Capillary Refill: Capillary refill takes 2 to 3 seconds.     Coloration: Skin is not jaundiced or pale.      Findings: No bruising or erythema.  Neurological:     General: No focal deficit present.     Mental Status: She is alert and oriented to person, place, and time.  Psychiatric:        Mood and Affect: Mood normal.        Behavior: Behavior normal.        Thought Content: Thought content normal.        Judgment: Judgment normal.      Assessment/Plan Symptomatic cholelithiasis with probable chronic cholecystitis.   Plan lap chole.  The surgical procedure was described to the patient in detail and her clinic appointment.  The patient was given educational material at that time.  I discussed the incision type and location, the location of the gallbladder, the anatomy of the bile ducts and arteries, and the typical progression of surgery.  I discussed the possibility of converting to an open operation.  I advised of the risks of bleeding, infection, damage to other structures (such as the bile duct, intestine or liver), bile leak, need for other procedures or surgeries, and post op diarrhea/constipation.  We discussed the risk of blood clot.  We discussed the recovery period and post operative restrictions.  The patient was advised against taking blood thinners the week before surgery.    Today I reviewed surgery and major risks/restrictions.    Patient wants to proceed.  Husband at bedside.     Almond Lint, MD 11/14/2022, 11:55 AM

## 2022-11-14 NOTE — Discharge Instructions (Addendum)
[facility] Office Phone Number [phone]   POST OP INSTRUCTIONS  Always review your discharge instruction sheet given to you by the facility where your surgery was performed.  IF YOU HAVE DISABILITY OR FAMILY LEAVE FORMS, YOU MUST BRING THEM TO THE OFFICE FOR PROCESSING.  DO NOT GIVE THEM TO YOUR DOCTOR.  Take 2 tylenol (acetominophen) three times a day for 3 days.  If you still have pain, add ibuprofen with food in between if able to take this (if you have kidney issues or stomach issues, do not take ibuprofen).  If both of those are not enough, add the narcotic pain pill.  If you find you are needing a lot of this overnight after surgery, call the next morning for a refill.   Take your usually prescribed medications unless otherwise directed If you need a refill on your pain medication, please contact your pharmacy.  They will contact our office to request authorization.  Prescriptions will not be filled after 5pm or on week-ends. You should eat very light the first 24 hours after surgery, such as soup, crackers, pudding, etc.  Resume your normal diet the day after surgery It is common to experience some constipation if taking pain medication after surgery.  Increasing fluid intake and taking a stool softener will usually help or prevent this problem from occurring.  A mild laxative (Milk of Magnesia or Miralax) should be taken according to package directions if there are no bowel movements after 48 hours. You may shower in 48 hours.  The surgical glue will flake off in 2-3 weeks.   ACTIVITIES:  No strenuous activity or heavy lifting for 1 week.   You may drive when you no longer are taking prescription pain medication, you can comfortably wear a seatbelt, and you can safely maneuver your car and apply brakes. RETURN TO WORK:  __________as tolerated if no lifting. _______________ [name] should see your doctor in the office for a follow-up appointment approximately three-four  weeks after your surgery.    WHEN TO CALL YOUR DOCTOR: Fever over 101.0 Nausea and/or vomiting. Extreme swelling or bruising. Continued bleeding from incision. Increased pain, redness, or drainage from the incision.  The clinic staff is available to answer your questions during regular business hours.  Please don't hesitate to call and ask to speak to one of the nurses for clinical concerns.  If you have a medical emergency, go to the nearest emergency room or call 911.  A surgeon from [facility] is always on call at the hospital.  For further questions, please visit [email or web address]

## 2022-11-15 ENCOUNTER — Encounter (HOSPITAL_COMMUNITY): Payer: Self-pay | Admitting: General Surgery

## 2022-11-15 LAB — SURGICAL PATHOLOGY

## 2022-11-25 DIAGNOSIS — M25561 Pain in right knee: Secondary | ICD-10-CM | POA: Diagnosis not present

## 2022-12-03 DIAGNOSIS — S83231A Complex tear of medial meniscus, current injury, right knee, initial encounter: Secondary | ICD-10-CM | POA: Diagnosis not present

## 2022-12-06 DIAGNOSIS — S83249A Other tear of medial meniscus, current injury, unspecified knee, initial encounter: Secondary | ICD-10-CM | POA: Insufficient documentation

## 2022-12-19 DIAGNOSIS — M179 Osteoarthritis of knee, unspecified: Secondary | ICD-10-CM | POA: Insufficient documentation

## 2022-12-19 DIAGNOSIS — M170 Bilateral primary osteoarthritis of knee: Secondary | ICD-10-CM | POA: Diagnosis not present

## 2022-12-26 ENCOUNTER — Other Ambulatory Visit (HOSPITAL_COMMUNITY): Payer: Self-pay

## 2022-12-26 DIAGNOSIS — M170 Bilateral primary osteoarthritis of knee: Secondary | ICD-10-CM | POA: Diagnosis not present

## 2023-01-02 DIAGNOSIS — M170 Bilateral primary osteoarthritis of knee: Secondary | ICD-10-CM | POA: Diagnosis not present

## 2023-01-29 DIAGNOSIS — S83241D Other tear of medial meniscus, current injury, right knee, subsequent encounter: Secondary | ICD-10-CM | POA: Diagnosis not present

## 2023-01-29 DIAGNOSIS — M1711 Unilateral primary osteoarthritis, right knee: Secondary | ICD-10-CM | POA: Diagnosis not present

## 2023-03-19 ENCOUNTER — Encounter: Payer: Self-pay | Admitting: Family Medicine

## 2023-03-19 ENCOUNTER — Other Ambulatory Visit (HOSPITAL_COMMUNITY)
Admission: RE | Admit: 2023-03-19 | Discharge: 2023-03-19 | Disposition: A | Discharge status: Home / Self Care | Source: Ambulatory Visit | Attending: Family Medicine | Admitting: Family Medicine

## 2023-03-19 ENCOUNTER — Ambulatory Visit: Payer: Commercial Managed Care - PPO | Admitting: Family Medicine

## 2023-03-19 VITALS — BP 117/80 | HR 70 | Temp 97.7°F | Ht 67.0 in | Wt 176.0 lb

## 2023-03-19 DIAGNOSIS — Z124 Encounter for screening for malignant neoplasm of cervix: Secondary | ICD-10-CM | POA: Insufficient documentation

## 2023-03-19 DIAGNOSIS — M1711 Unilateral primary osteoarthritis, right knee: Secondary | ICD-10-CM

## 2023-03-19 DIAGNOSIS — Z0000 Encounter for general adult medical examination without abnormal findings: Secondary | ICD-10-CM | POA: Diagnosis not present

## 2023-03-19 DIAGNOSIS — K5000 Crohn's disease of small intestine without complications: Secondary | ICD-10-CM | POA: Diagnosis not present

## 2023-03-19 DIAGNOSIS — Z136 Encounter for screening for cardiovascular disorders: Secondary | ICD-10-CM

## 2023-03-19 DIAGNOSIS — S83249D Other tear of medial meniscus, current injury, unspecified knee, subsequent encounter: Secondary | ICD-10-CM

## 2023-03-19 DIAGNOSIS — E890 Postprocedural hypothyroidism: Secondary | ICD-10-CM

## 2023-03-19 DIAGNOSIS — Z1231 Encounter for screening mammogram for malignant neoplasm of breast: Secondary | ICD-10-CM

## 2023-03-19 DIAGNOSIS — K9041 Non-celiac gluten sensitivity: Secondary | ICD-10-CM

## 2023-03-19 LAB — COMPREHENSIVE METABOLIC PANEL
ALT: 14 U/L (ref 0–35)
AST: 21 U/L (ref 0–37)
Albumin: 4.3 g/dL (ref 3.5–5.2)
Alkaline Phosphatase: 54 U/L (ref 39–117)
BUN: 17 mg/dL (ref 6–23)
CO2: 26 meq/L (ref 19–32)
Calcium: 9.8 mg/dL (ref 8.4–10.5)
Chloride: 104 meq/L (ref 96–112)
Creatinine, Ser: 0.76 mg/dL (ref 0.40–1.20)
GFR: 94.75 mL/min (ref >60.00)
Glucose, Bld: 91 mg/dL (ref 70–99)
Potassium: 3.9 meq/L (ref 3.5–5.1)
Sodium: 136 meq/L (ref 135–145)
Total Bilirubin: 0.6 mg/dL (ref 0.2–1.2)
Total Protein: 7.4 g/dL (ref 6.0–8.3)

## 2023-03-19 LAB — CBC WITH DIFFERENTIAL/PLATELET
Basophils Absolute: 0 10*3/uL (ref 0.0–0.1)
Basophils Relative: 0.7 % (ref 0.0–3.0)
Eosinophils Absolute: 0.2 10*3/uL (ref 0.0–0.7)
Eosinophils Relative: 3.1 % (ref 0.0–5.0)
HCT: 42.1 % (ref 36.0–46.0)
Hemoglobin: 14.1 g/dL (ref 12.0–15.0)
Lymphocytes Relative: 26.8 % (ref 12.0–46.0)
Lymphs Abs: 1.7 10*3/uL (ref 0.7–4.0)
MCHC: 33.6 g/dL (ref 30.0–36.0)
MCV: 94 fl (ref 78.0–100.0)
Monocytes Absolute: 0.5 10*3/uL (ref 0.1–1.0)
Monocytes Relative: 8.1 % (ref 3.0–12.0)
Neutro Abs: 3.9 10*3/uL (ref 1.4–7.7)
Neutrophils Relative %: 61.3 % (ref 43.0–77.0)
Platelets: 243 10*3/uL (ref 150.0–400.0)
RBC: 4.48 Mil/uL (ref 3.87–5.11)
RDW: 13.6 % (ref 11.5–15.5)
WBC: 6.4 10*3/uL (ref 4.0–10.5)

## 2023-03-19 LAB — LIPID PANEL
Cholesterol: 175 mg/dL (ref 0–200)
HDL: 74 mg/dL (ref >39.00)
LDL Cholesterol: 91 mg/dL (ref 0–99)
NonHDL: 101.4
Total CHOL/HDL Ratio: 2
Triglycerides: 51 mg/dL (ref 0.0–149.0)
VLDL: 10.2 mg/dL (ref 0.0–40.0)

## 2023-03-19 LAB — T4, FREE: Free T4: 1.48 ng/dL (ref 0.60–1.60)

## 2023-03-19 LAB — TSH: TSH: 0.18 u[IU]/mL — ABNORMAL LOW (ref 0.35–5.50)

## 2023-03-19 NOTE — Patient Instructions (Signed)
 Please return in 12 months for your annual complete physical; please come fasting.   I will release your lab results to you on your MyChart account with further instructions. You may see the results before I do, but when I review them I will send you a message with my report or have my assistant call you if things need to be discussed. Please reply to my message with any questions. Thank you!   If you have any questions or concerns, please don't hesitate to send me a message via MyChart or call the office at [phone]. Thank you for visiting with [place] today! It's our pleasure caring for you.   Please call the office checked below to schedule your appointment for your mammogram and/or bone density screen (the checked studies were ordered): [x]   Mammogram  []   Bone Density  []   [facility]     [address] [place], [place]        [phone]         []   [facility]  [address] [place], [place]  [phone]
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
## 2023-03-20 ENCOUNTER — Encounter: Payer: Self-pay | Admitting: Family Medicine

## 2023-03-20 LAB — T3: T3, Total: 79 ng/dL (ref 76–181)

## 2023-03-20 MED ORDER — LEVOTHYROXINE SODIUM 175 MCG PO TABS: ORAL_TABLET | ORAL | Status: DC

## 2023-03-20 MED ORDER — LEVOTHYROXINE SODIUM 150 MCG PO TABS
150.0000 ug | ORAL_TABLET | ORAL | 3 refills | Status: AC
  Filled 2023-03-20: qty 24, 84d supply, fill #0
  Filled 2023-05-29: qty 24, 84d supply, fill #1
  Filled 2023-07-09 – 2023-09-14 (×4): qty 24, 84d supply, fill #2
  Filled 2023-10-30 – 2023-12-11 (×2): qty 24, 84d supply, fill #3

## 2023-03-20 NOTE — Progress Notes (Signed)
 See mychart note Dear Sophia Blackwell, Your lab results look good overall. However, your TSH is lower than we'd like, even lower than last year. I recommend decreasing the dose of your levothyroxine and repeating the TSH test in 8 weeks. Let's just lower it slightly: continue taking your daily m-f, and I will order dose to be taken on Saturdays and Sundays. You can call and schedule a lab visit for that. All else is well! Sincerely, Dr. Mardelle Matte

## 2023-03-20 NOTE — Addendum Note (Signed)
 Addended by: Asencion Partridge on: 03/20/2023 07:16 PM   Modules accepted: Orders

## 2023-03-21 ENCOUNTER — Other Ambulatory Visit (HOSPITAL_COMMUNITY): Payer: Self-pay

## 2023-03-21 ENCOUNTER — Encounter: Payer: Self-pay | Admitting: Family Medicine

## 2023-03-21 ENCOUNTER — Other Ambulatory Visit: Payer: Self-pay

## 2023-03-21 LAB — CYTOLOGY - PAP
Comment: NEGATIVE
Diagnosis: NEGATIVE
High risk HPV: NEGATIVE

## 2023-03-21 NOTE — Progress Notes (Signed)
Nl pap and neg HR HPV screen; mychart note sent. Repeat in 5 years.

## 2023-03-29 ENCOUNTER — Other Ambulatory Visit: Payer: Self-pay | Admitting: Family Medicine

## 2023-04-01 ENCOUNTER — Other Ambulatory Visit (HOSPITAL_COMMUNITY): Payer: Self-pay

## 2023-04-01 ENCOUNTER — Other Ambulatory Visit: Payer: Self-pay

## 2023-04-01 MED ORDER — PANTOPRAZOLE SODIUM 40 MG PO TBEC
DELAYED_RELEASE_TABLET | ORAL | 3 refills | Status: DC
  Filled 2023-04-01: qty 90, 90d supply, fill #0
  Filled 2023-04-02: qty 180, 90d supply, fill #0
  Filled 2023-07-09: qty 180, 90d supply, fill #1

## 2023-04-01 MED ORDER — LEVOTHYROXINE SODIUM 175 MCG PO TABS: ORAL_TABLET | ORAL | Status: AC

## 2023-04-02 ENCOUNTER — Other Ambulatory Visit: Payer: Self-pay

## 2023-04-02 ENCOUNTER — Other Ambulatory Visit: Payer: Self-pay | Admitting: Family Medicine

## 2023-04-07 ENCOUNTER — Ambulatory Visit

## 2023-04-08 ENCOUNTER — Other Ambulatory Visit: Payer: Self-pay | Admitting: Family Medicine

## 2023-04-11 ENCOUNTER — Other Ambulatory Visit (HOSPITAL_COMMUNITY): Payer: Self-pay

## 2023-04-11 ENCOUNTER — Other Ambulatory Visit: Payer: Self-pay | Admitting: Family Medicine

## 2023-04-11 MED ORDER — LEVOTHYROXINE SODIUM 175 MCG PO TABS
175.0000 ug | ORAL_TABLET | Freq: Every day | ORAL | 3 refills | Status: AC
  Filled 2023-04-11: qty 90, 90d supply, fill #0
  Filled 2023-07-09: qty 90, 90d supply, fill #1
  Filled 2023-10-30 – 2023-12-11 (×2): qty 90, 90d supply, fill #2

## 2023-04-17 ENCOUNTER — Ambulatory Visit
Admission: RE | Admit: 2023-04-17 | Discharge: 2023-04-17 | Disposition: A | Discharge status: Home / Self Care | Source: Ambulatory Visit | Attending: Family Medicine | Admitting: Family Medicine

## 2023-04-17 DIAGNOSIS — Z1231 Encounter for screening mammogram for malignant neoplasm of breast: Secondary | ICD-10-CM | POA: Diagnosis not present

## 2023-05-14 ENCOUNTER — Telehealth: Payer: Self-pay | Admitting: Family Medicine

## 2023-05-14 ENCOUNTER — Other Ambulatory Visit: Payer: Self-pay

## 2023-05-14 DIAGNOSIS — R7989 Other specified abnormal findings of blood chemistry: Secondary | ICD-10-CM

## 2023-05-14 NOTE — Telephone Encounter (Signed)
 Pt called and states Dr Jonelle Neri told her to come back in 4 weeks to get labs done but I do not see any lab orders. Please advise.

## 2023-05-19 ENCOUNTER — Other Ambulatory Visit (INDEPENDENT_AMBULATORY_CARE_PROVIDER_SITE_OTHER)

## 2023-05-19 ENCOUNTER — Other Ambulatory Visit

## 2023-05-19 DIAGNOSIS — R7989 Other specified abnormal findings of blood chemistry: Secondary | ICD-10-CM

## 2023-05-20 LAB — TSH: TSH: 0.31 u[IU]/mL — ABNORMAL LOW (ref 0.35–5.50)

## 2023-05-28 ENCOUNTER — Ambulatory Visit: Payer: Self-pay | Admitting: Family Medicine

## 2023-05-28 NOTE — Progress Notes (Signed)
 See mychart note Hi Sophia Blackwell, Sorry for the late lab review! As you can see, that little decrease in your dosing was not sufficient as your TSH remains suppressed. But it is close.  I recommend another slight change: take the 150mcg M-Th, and take the 100mcg F-Sunday. That should put you in range.   Sincerely, Dr. Jonelle Neri

## 2023-05-29 ENCOUNTER — Encounter: Payer: Self-pay | Admitting: Family Medicine

## 2023-05-29 DIAGNOSIS — E890 Postprocedural hypothyroidism: Secondary | ICD-10-CM

## 2023-06-10 NOTE — Telephone Encounter (Signed)
 Per Dr. Jonelle Neri:  Pt is to take 175mcg of levothyroxine  M-Thurs and 150mcg on F, S, + Sunday. Pt has been made aware and and understands new orders.   Pt stated that she was taking on M-Friday and 150mcg on Sat/Sun.  Pt always wanted to know when would you like for her to do a repeat of Blood work and she will contact us  when she will need a refill?

## 2023-06-19 IMAGING — MG DIGITAL SCREENING BILAT W/ CAD
4 series · 4 of 4 positions shown · non-contrast
Comparison: None.

CLINICAL DATA: Screening.

EXAM:
DIGITAL SCREENING BILATERAL MAMMOGRAM WITH CAD
TECHNIQUE: Bilateral screening digital craniocaudal and mediolateral oblique
mammograms were obtained. The images were evaluated with
computer-aided detection.

[R MLO]
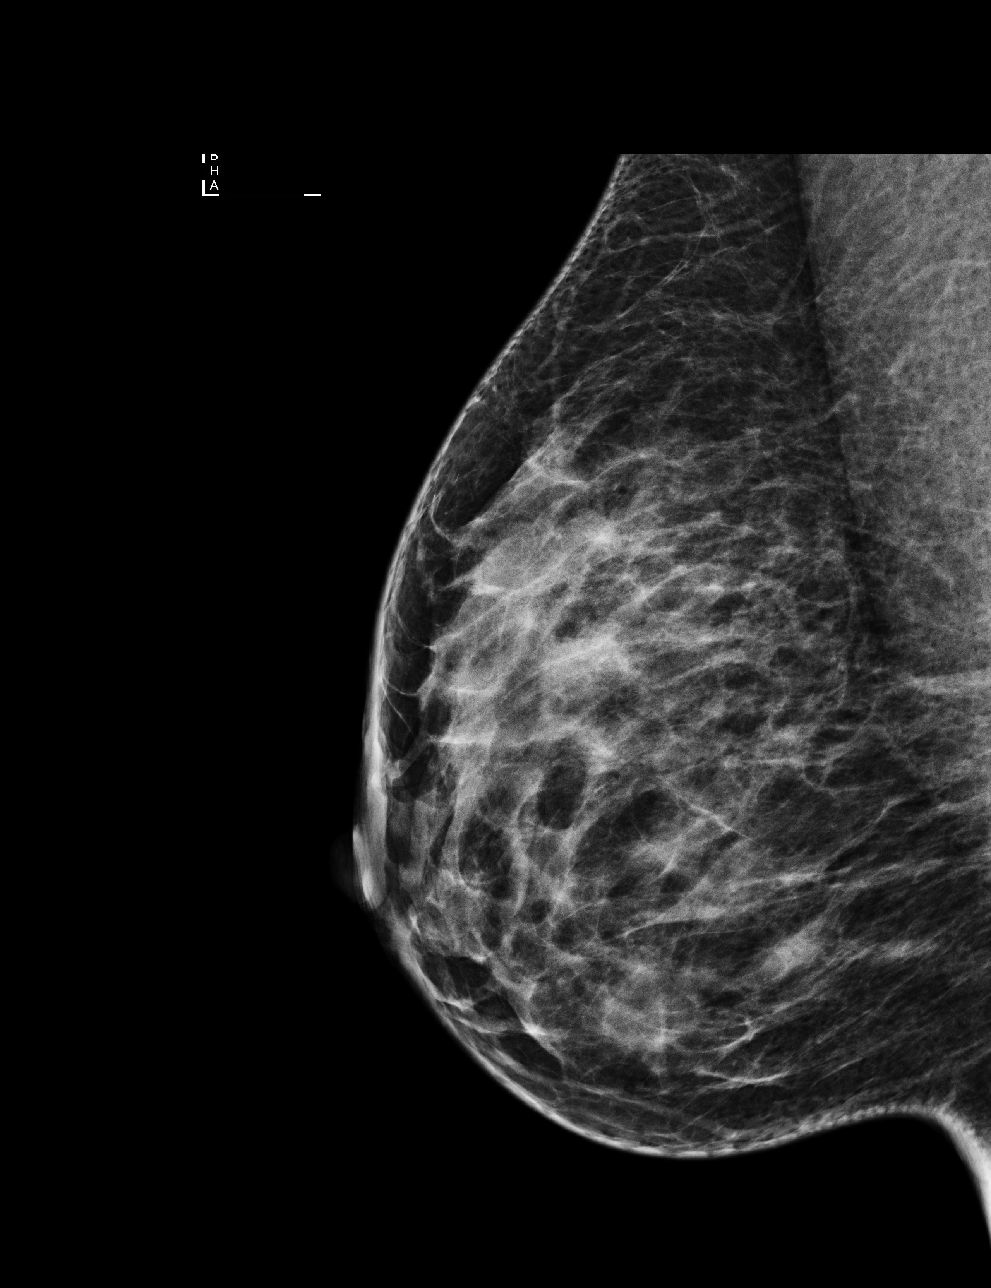

[L CC]
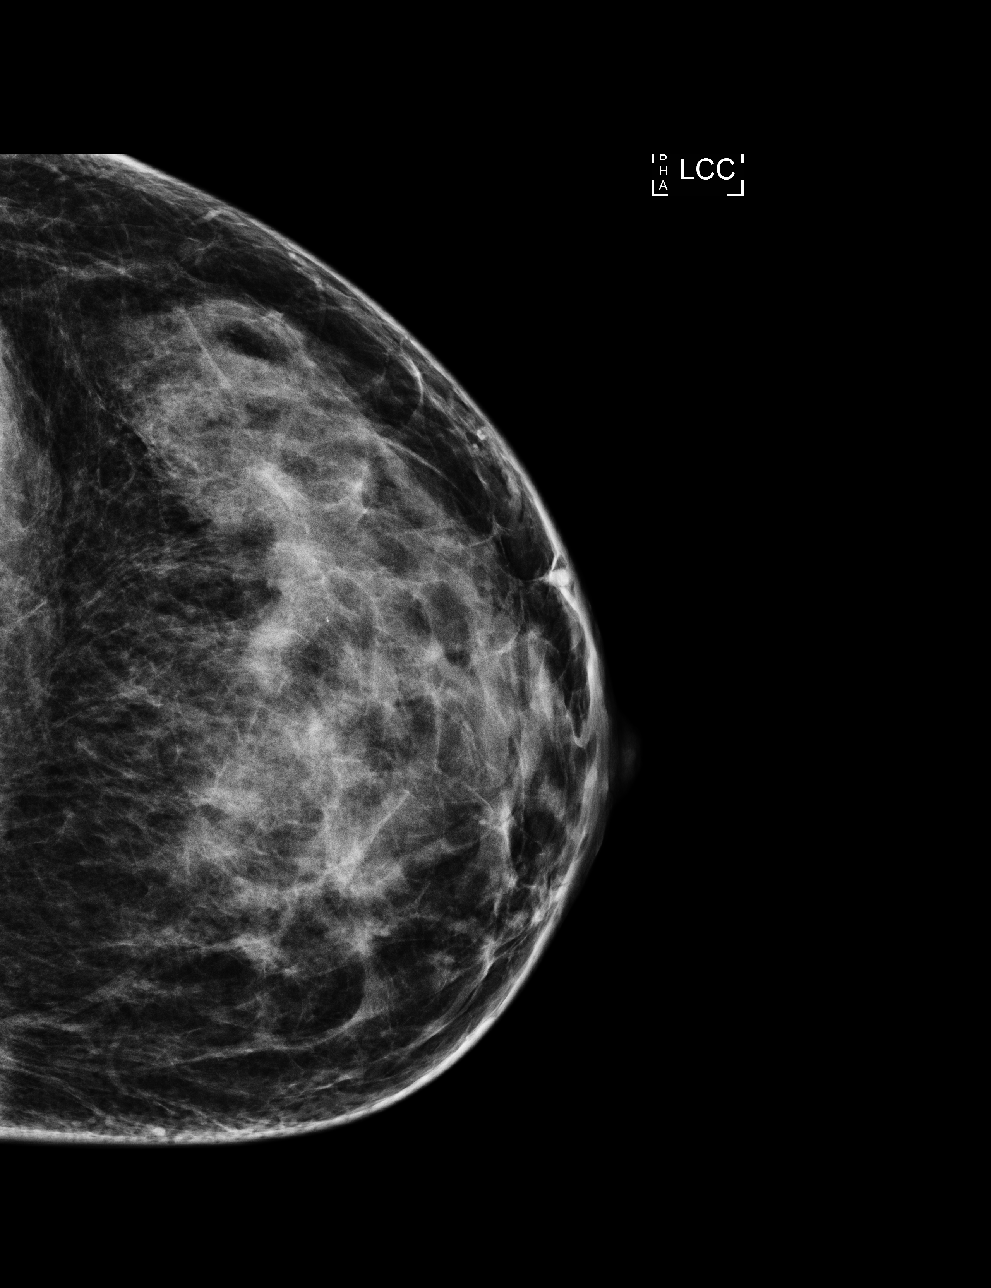

[R CC]
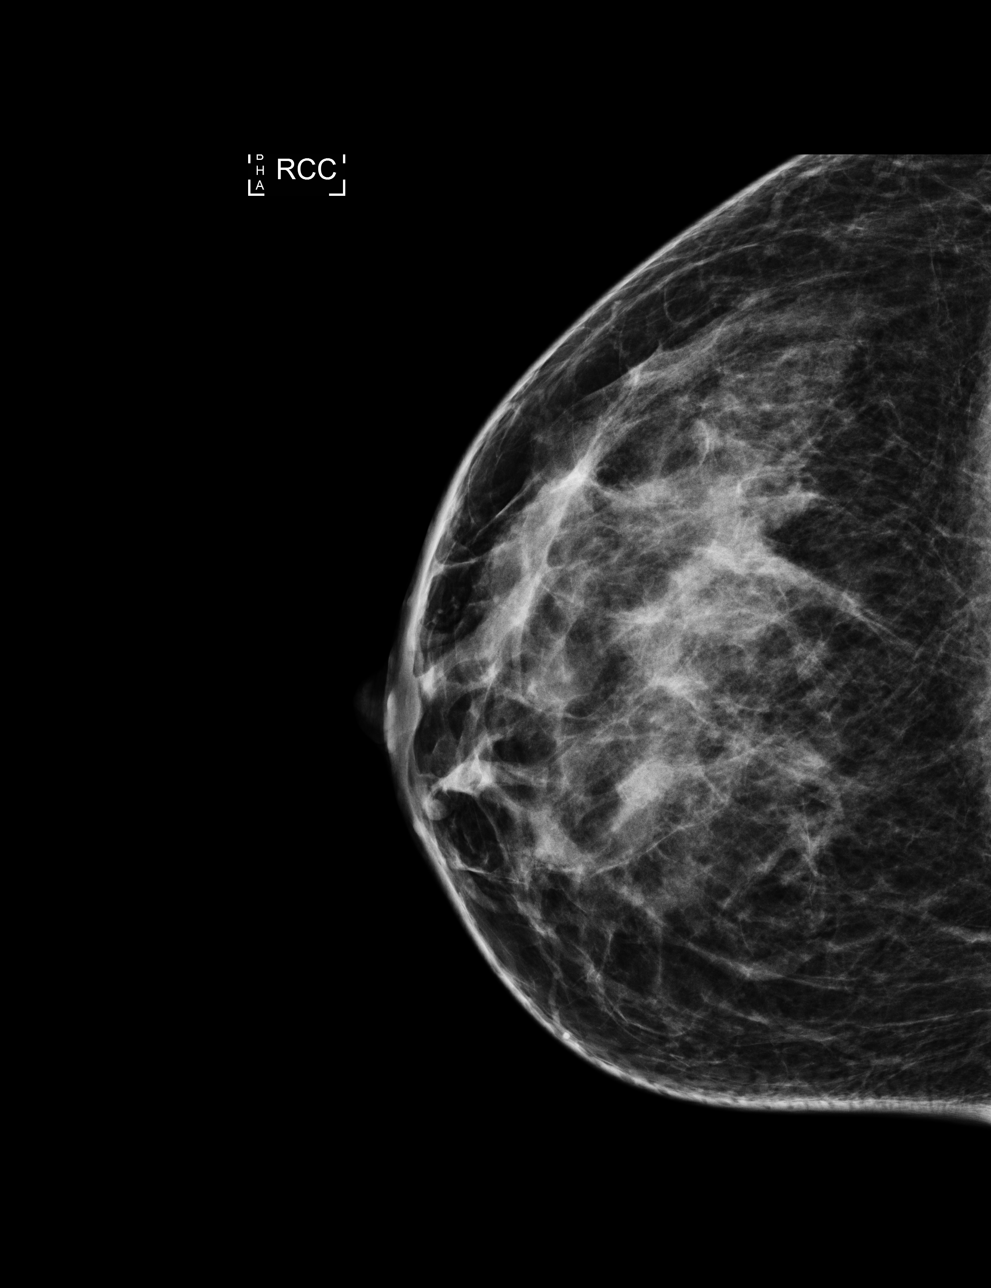

[L MLO]
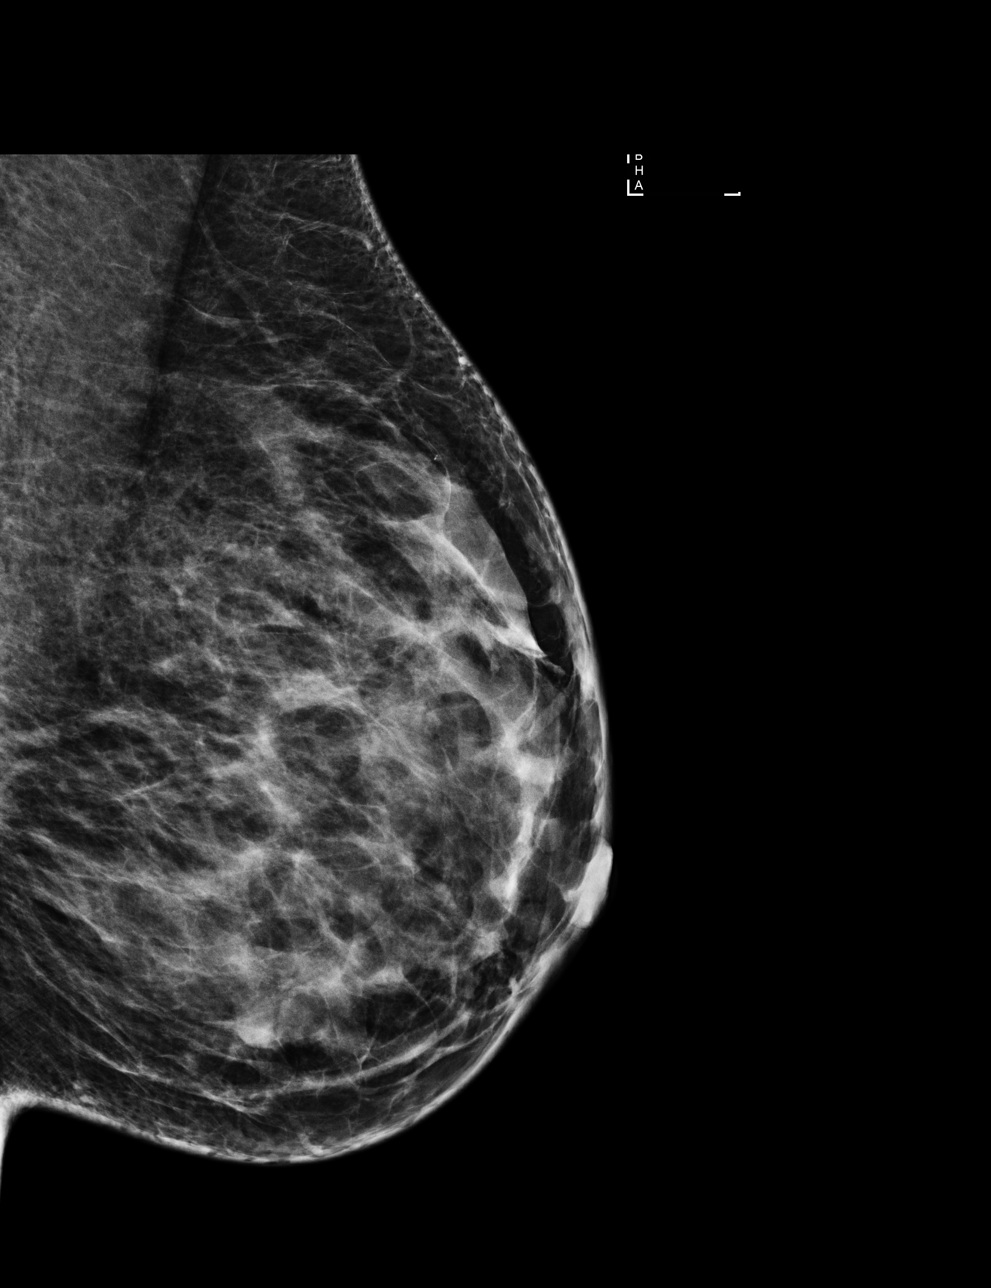

[4 of 4 positions shown; findings below may reference images not displayed]

ACR Breast Density Category c: The breast tissue is heterogeneously
dense, which may obscure small masses
FINDINGS: There are no findings suspicious for malignancy.
IMPRESSION: No mammographic evidence of malignancy. A result letter of this
screening mammogram will be mailed directly to the patient.

RECOMMENDATION:
Screening mammogram in one year. (Code:FP-P-CVD)

BI-RADS CATEGORY  1: Negative.

## 2023-07-09 ENCOUNTER — Other Ambulatory Visit: Payer: Self-pay

## 2023-07-09 ENCOUNTER — Other Ambulatory Visit (HOSPITAL_BASED_OUTPATIENT_CLINIC_OR_DEPARTMENT_OTHER): Payer: Self-pay

## 2023-07-21 ENCOUNTER — Other Ambulatory Visit (HOSPITAL_BASED_OUTPATIENT_CLINIC_OR_DEPARTMENT_OTHER): Payer: Self-pay

## 2023-08-11 ENCOUNTER — Other Ambulatory Visit (HOSPITAL_BASED_OUTPATIENT_CLINIC_OR_DEPARTMENT_OTHER): Payer: Self-pay

## 2023-08-28 DIAGNOSIS — M170 Bilateral primary osteoarthritis of knee: Secondary | ICD-10-CM | POA: Diagnosis not present

## 2023-10-29 ENCOUNTER — Encounter: Payer: Self-pay | Admitting: Family Medicine

## 2023-10-30 ENCOUNTER — Other Ambulatory Visit (HOSPITAL_BASED_OUTPATIENT_CLINIC_OR_DEPARTMENT_OTHER): Payer: Self-pay

## 2023-10-30 ENCOUNTER — Other Ambulatory Visit

## 2023-10-30 ENCOUNTER — Other Ambulatory Visit: Payer: Self-pay

## 2023-10-30 ENCOUNTER — Ambulatory Visit: Payer: Self-pay | Admitting: Family Medicine

## 2023-10-30 DIAGNOSIS — E890 Postprocedural hypothyroidism: Secondary | ICD-10-CM | POA: Diagnosis not present

## 2023-10-30 LAB — TSH: TSH: 2.66 u[IU]/mL (ref 0.35–5.50)

## 2023-10-30 NOTE — Progress Notes (Signed)
 See mychart note Hi Alara! Congrats on your state championship! Way to go.   And your TSH is now good. Please continue your current dose of levothyroxine .  Sincerely, Dr. Jodie

## 2023-11-10 ENCOUNTER — Other Ambulatory Visit (HOSPITAL_BASED_OUTPATIENT_CLINIC_OR_DEPARTMENT_OTHER): Payer: Self-pay

## 2023-11-12 DIAGNOSIS — M1712 Unilateral primary osteoarthritis, left knee: Secondary | ICD-10-CM | POA: Diagnosis not present

## 2023-11-12 DIAGNOSIS — M1711 Unilateral primary osteoarthritis, right knee: Secondary | ICD-10-CM | POA: Diagnosis not present

## 2023-11-12 DIAGNOSIS — M170 Bilateral primary osteoarthritis of knee: Secondary | ICD-10-CM | POA: Diagnosis not present

## 2023-12-11 ENCOUNTER — Other Ambulatory Visit: Payer: Self-pay | Admitting: Family Medicine

## 2023-12-12 ENCOUNTER — Other Ambulatory Visit (HOSPITAL_BASED_OUTPATIENT_CLINIC_OR_DEPARTMENT_OTHER): Payer: Self-pay

## 2023-12-12 ENCOUNTER — Other Ambulatory Visit: Payer: Self-pay

## 2023-12-15 NOTE — Telephone Encounter (Signed)
 03/19/2023 LOV  04/01/2023 fill date  90/0 refills

## 2023-12-18 MED ORDER — PANTOPRAZOLE SODIUM 40 MG PO TBEC
DELAYED_RELEASE_TABLET | ORAL | 3 refills | Status: AC
  Filled 2023-12-18: qty 90, 90d supply, fill #0

## 2023-12-19 ENCOUNTER — Other Ambulatory Visit (HOSPITAL_BASED_OUTPATIENT_CLINIC_OR_DEPARTMENT_OTHER): Payer: Self-pay

## 2023-12-29 ENCOUNTER — Other Ambulatory Visit (HOSPITAL_BASED_OUTPATIENT_CLINIC_OR_DEPARTMENT_OTHER): Payer: Self-pay

## 2024-03-23 ENCOUNTER — Encounter: Admitting: Family Medicine
